# Patient Record
Sex: Male | Born: 1945 | Race: White | Hispanic: No | Marital: Married | Smoking: Former smoker
Health system: Southern US, Community
[De-identification: ages and names within clinical notes are randomized; demographics above are authoritative.]

## PROBLEM LIST (undated history)

## (undated) DIAGNOSIS — R042 Hemoptysis: Secondary | ICD-10-CM

## (undated) DIAGNOSIS — K219 Gastro-esophageal reflux disease without esophagitis: Secondary | ICD-10-CM

## (undated) DIAGNOSIS — J439 Emphysema, unspecified: Secondary | ICD-10-CM

## (undated) DIAGNOSIS — M4317 Spondylolisthesis, lumbosacral region: Secondary | ICD-10-CM

## (undated) DIAGNOSIS — M1990 Unspecified osteoarthritis, unspecified site: Secondary | ICD-10-CM

## (undated) DIAGNOSIS — K5792 Diverticulitis of intestine, part unspecified, without perforation or abscess without bleeding: Secondary | ICD-10-CM

## (undated) DIAGNOSIS — J449 Chronic obstructive pulmonary disease, unspecified: Secondary | ICD-10-CM

## (undated) DIAGNOSIS — N400 Enlarged prostate without lower urinary tract symptoms: Secondary | ICD-10-CM

## (undated) DIAGNOSIS — J310 Chronic rhinitis: Secondary | ICD-10-CM

## (undated) HISTORY — PX: HERNIA REPAIR: SHX51

## (undated) HISTORY — PX: COLONOSCOPY: SHX174

## (undated) HISTORY — DX: Emphysema, unspecified: J43.9

## (undated) HISTORY — DX: Benign prostatic hyperplasia without lower urinary tract symptoms: N40.0

## (undated) HISTORY — DX: Chronic obstructive pulmonary disease, unspecified: J44.9

## (undated) HISTORY — DX: Gastro-esophageal reflux disease without esophagitis: K21.9

## (undated) HISTORY — DX: Diverticulitis of intestine, part unspecified, without perforation or abscess without bleeding: K57.92

## (undated) HISTORY — PX: NASAL SINUS SURGERY: SHX719

## (undated) HISTORY — PX: TONSILLECTOMY: SUR1361

## (undated) HISTORY — DX: Hemoptysis: R04.2

## (undated) HISTORY — DX: Spondylolisthesis, lumbosacral region: M43.17

## (undated) HISTORY — PX: OTHER SURGICAL HISTORY: SHX169

## (undated) HISTORY — DX: Unspecified osteoarthritis, unspecified site: M19.90

## (undated) HISTORY — DX: Chronic rhinitis: J31.0

## (undated) HISTORY — PX: TRANSURETHRAL RESECTION OF PROSTATE: SHX73

---

## 2009-07-10 LAB — PULMONARY FUNCTION TEST

## 2013-02-01 LAB — PULMONARY FUNCTION TEST

## 2013-02-07 LAB — PULMONARY FUNCTION TEST

## 2013-02-22 DIAGNOSIS — R911 Solitary pulmonary nodule: Secondary | ICD-10-CM | POA: Insufficient documentation

## 2013-02-23 ENCOUNTER — Encounter: Payer: Self-pay | Admitting: Surgery

## 2013-02-23 ENCOUNTER — Institutional Professional Consult (permissible substitution) (INDEPENDENT_AMBULATORY_CARE_PROVIDER_SITE_OTHER): Payer: Medicare Other | Admitting: Surgery

## 2013-02-23 VITALS — BP 113/69 | HR 74 | Resp 20 | Ht 71.0 in | Wt 156.0 lb

## 2013-02-23 DIAGNOSIS — R911 Solitary pulmonary nodule: Secondary | ICD-10-CM

## 2013-02-24 ENCOUNTER — Other Ambulatory Visit: Payer: Self-pay | Admitting: *Deleted

## 2013-02-24 ENCOUNTER — Encounter: Payer: Self-pay | Admitting: Surgery

## 2013-02-24 DIAGNOSIS — R911 Solitary pulmonary nodule: Secondary | ICD-10-CM

## 2013-02-24 NOTE — Progress Notes (Signed)
PCP is Bretta Bang, DO Referring Provider is Sharyon Medicus, MD  Chief Complaint  Patient presents with  . Lung Lesion    Surgical eval on Pulmonary nodule, PET Scan 02/18/13, Chest CT 02/08/13    HPI:  The patient is a 67 year old former smoker with COPD who lives in [place], [place] and was first evaluated by Dr. Artis Flock of [facility] on 02/01/2013. His pulmonary history dates back to 2011 when he developed some shortness of breath while hiking in [place]. Upon return to [place] he underwent workup including a CXR on 06/14/2009 that showed some linear streaking in the lung bases suggesting atelectasis or scarring. PFT's on 07/10/2009 at [facility] showed an FEV1 of 2.24  (63% predicted), and FVC of 5.27 (119%), and an FEV1/FVC of 42%. Diffusion capacity was 88%. There was no significant improvement in spirometry with bronchodilators. He was started on ProAir [facility] inhaler and in Feb 2014 he was started on Spiriva which he thinks improved his breathing. He enrolled in pulmonary rehab and did well. He says that he currently walks 3-4 miles per day but does get short of breath walking up hills. He denies cough and sputum production. He had a CXR done on 02/01/2013 which showed a question of a small mass at the right apex approximately 1.5 cm in size that was not seen on the prior exam. He underwent a CT of the chest on 02/08/2013 that showed a solid 13 mm nodule in the middle of the RUL that was suspicious for malignancy and an ill-defined spiculated density in the right lung apex that could be scarring or a second malignant nodule. There were significant emphysematous changes in the lungs, particularly the upper lobes. A PET scan at [facility] on 02/18/2013 showed hypermetabolic activity in both right upper lobe nodules but no SUV was reported. There was a question of three mildly hypermetabolic foci in the region of the right hilum. There was a mildly  hypermetabolic focus along the mesenteric side of a loop of small bowel which may be physiologic. He had repeat PFT's in the pulmonary medicine office that showed an FEV! Of 1.61 (45%) and FVC of 3.83 (82%) and a ratio of 42% consistent with severe obstructive lung disease. Compared to his previous results in 2011 there was a significant reduction in the FEV1 and FVC.   He did have a cardiac stress test in 2011 at [facility] that was reportedly negative for inducible ischemia.  Past Medical History  Diagnosis Date  . Emphysema   . Hemoptysis   . BPH (benign prostatic hyperplasia)   . COPD (chronic obstructive pulmonary disease)   . DJD (degenerative joint disease)     hands  . GERD (gastroesophageal reflux disease)   . Diverticulitis   . Osteoarthritis   . Rhinitis   . Spondylolisthesis at L5-S1 level from prior trauma. Underwent R & L lumbar facet radiofrequency denervation.       squamous papilloma removed in 2012, left ture vocal cord    Past Surgical History  Procedure Laterality Date  . Hernia repair      bilateral injuinal hernia repair 2012 Dr Dimas Chyle  . Colonoscopy      2008/2013, adenomatous polyps  . Compression fracture   l4 repair    . 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 Left knee arthroscopy, removed calcified deposit    .  Left patella dislocation repair    . Nasal sinus surgery    . Tonsillectomy    . Transurethral resection of prostate      1996    Family History  Problem Relation Age of Onset  . Heart disease Father   . Alzheimer's disease Mother     Social History History  Substance Use Topics  . Smoking status: Former Smoker    Types: Cigarettes    Quit date: 03/17/2008  . Smokeless tobacco: Never Used  . Alcohol Use: Yes    Current Outpatient Prescriptions  Medication Sig Dispense Refill  . acidophilus (RISAQUAD) CAPS capsule Take 1 capsule by mouth daily.      .  Cholecalciferol (VITAMIN D) 2000 UNITS CAPS Take by mouth daily.      . fluticasone (FLONASE) 50 MCG/ACT nasal spray Place into both nostrils daily.      . Fluticasone Furoate-Vilanterol (BREO ELLIPTA) 100-25 MCG/INH AEPB Inhale into the lungs daily.      Marland Kitchen ibuprofen (ADVIL,MOTRIN) 200 MG tablet Take 200 mg by mouth every 6 (six) hours as needed.      Marland Kitchen omeprazole (PRILOSEC) 20 MG capsule Take 20 mg by mouth 2 (two) times daily before a meal.      . sildenafil (VIAGRA) 50 MG tablet Take 50 mg by mouth daily as needed for erectile dysfunction.      Marland Kitchen tiotropium (SPIRIVA) 18 MCG inhalation capsule Place 18 mcg into inhaler and inhale daily.      . valACYclovir (VALTREX) 500 MG tablet Take 500 mg by mouth 2 (two) times daily.       No current facility-administered medications for this visit.    No Known Allergies  Review of Systems  Constitutional: Positive for appetite change and unexpected weight change. Negative for fever, chills, diaphoresis, activity change and fatigue.       His wife feels that his appetite and weight have been reduced over the past year.  HENT: Negative.   Eyes: Negative.   Respiratory: Positive for shortness of breath. Negative for cough.        Exertional  No sputum production  Cardiovascular: Negative for chest pain, palpitations and leg swelling.  Gastrointestinal: Negative.   Endocrine: Negative.   Genitourinary: Negative.   Musculoskeletal: Positive for arthralgias and back pain. Negative for gait problem and joint swelling.  Skin: Negative.   Allergic/Immunologic: Negative.   Neurological: Negative.   Hematological: Negative.   Psychiatric/Behavioral: Negative.     BP 113/69  Pulse 74  Resp 20  Ht 5\' 11"  (1.803 m)  Wt 156 lb (70.761 kg)  BMI 21.77 kg/m2  SpO2 98% Physical Exam  Constitutional: He is oriented to person, place, and time. He appears well-developed and well-nourished. No distress.  HENT:  Head: Normocephalic and atraumatic.    Mouth/Throat: Oropharynx is clear and moist. No oropharyngeal exudate.  Eyes: Conjunctivae and EOM are normal. Pupils are equal, round, and reactive to light.  Neck: Normal range of motion. Neck supple. No JVD present. No tracheal deviation present. No thyromegaly present.  Cardiovascular: Normal rate, regular rhythm, normal heart sounds and intact distal pulses.   No murmur heard. Pulmonary/Chest: Effort normal. He has no wheezes. He has no rales. He exhibits no tenderness.  Diminished breath sounds throughout  Abdominal: Soft. Bowel sounds are normal. He exhibits no distension and no mass. There is no tenderness.  Musculoskeletal: Normal range of motion. He exhibits no edema and no tenderness.  Lymphadenopathy:    He has  no cervical adenopathy.  Neurological: He is alert and oriented to person, place, and time. He has normal strength. No cranial nerve deficit or sensory deficit.  Skin: Skin is warm and dry.  Psychiatric: He has a normal mood and affect.     Diagnostic Tests:  As noted in HPI. I personally reviewed the chest CT and PET scan on disc. Both done at outside institutions.   Impression:  He has two hypermetabolic nodules in the right upper lobe. The more central and solid nodule is highly suspicious for a lung cancer. The more ill-defined and spiculated nodule in the apex could be a synchronous primary cancer or an area of scar or inflammation. It is also possible that one of these is a primary and one is a metastasis. I don't see any other clear areas of hypermetabolism to suggest metastasis. The radiologist questioned the possibility of faint hypermetabolism in the right hilum but I really don't see this and there is no lymphadenopathy on the CT to correlate with this. There is no mediastinal adenopathy. His PFT's are borderline for right upper lobectomy although the RUL has significant bullous changes so it may not be contributing much to his lung function. The more central  and suspicious lung nodule would not be resectable without lobectomy. Both of these lesion would be amenable to SBRT but the prognosis for disease free survival would be significantly lower with XRT than with complete surgical removal. I discussed the options of surgery for right upper lobectomy vs XRT with the patient and his wife. We discussed the benefits and risks of both treatment modalities and they clearly want to proceed with surgical removal. They understand the higher risk of lobectomy given his lung function and that he may have more exertional dyspnea postop. If we are going to treat this surgically I don't think a biopsy of the nodules is needed since a negative biopsy will not change things and he would be at significant risk of lung collapse with a biopsy that may require a chest tube and prolonged hospitalization. This would only increase his risk for lobectomy. I reviewed the CT and PET scans with the patient and his wife, discussed the surgery of right thoracotomy and right upper lobectomy with them including alternatives, benefits and risks, including but not limited to bleeding, blood transfusion, infection, prolonged air leak, pleural space problems, respiratory or other organ failure, and death. I also discussed the possibility that the final pathology may show hilar or mediastinal lymph node mets requiring chemotherapy. They understand all of this and agree to proceed.    Plan:  The patient will be scheduled for flexible bronchoscopy, right thoracotomy and right upper lobectomy shortly after the Christmas holiday.

## 2013-03-01 ENCOUNTER — Encounter (HOSPITAL_COMMUNITY): Payer: Self-pay | Admitting: Pharmacy Technician

## 2013-03-11 ENCOUNTER — Encounter (HOSPITAL_COMMUNITY): Payer: Self-pay | Admitting: *Deleted

## 2013-03-14 MED ORDER — DEXTROSE 5 % IV SOLN
1.5000 g | INTRAVENOUS | Status: AC
  Administered 2013-03-15: 1.5 g via INTRAVENOUS
  Filled 2013-03-14: qty 1.5

## 2013-03-14 NOTE — H&P (Signed)
[address]       [name] [address]             [phone]      Cardiothoracic Surgery History and Physical   PCP is Bretta Bang, DO Referring Provider is Sharyon Medicus, MD    Chief Complaint   Patient presents with   .  Lung Lesion       Surgical eval on Pulmonary nodule, PET Scan 02/18/13, Chest CT 02/08/13      HPI:   The patient is a 67 year old former smoker with COPD who lives in [place], [place] and was first evaluated by Dr. Artis Flock of [facility]ology on 02/01/2013. His pulmonary history dates back to 2011 when he developed some shortness of breath while hiking in [place]. Upon return to [place] he underwent workup including a CXR on 06/14/2009 that showed some linear streaking in the lung bases suggesting atelectasis or scarring. PFT's on 07/10/2009 at [facility] showed an FEV1 of 2.24  (63% predicted), and FVC of 5.27 (119%), and an FEV1/FVC of 42%. Diffusion capacity was 88%. There was no significant improvement in spirometry with bronchodilators. He was started on ProAir [facility] inhaler and in Feb 2014 he was started on Spiriva which he thinks improved his breathing. He enrolled in pulmonary rehab and did well. He says that he currently walks 3-4 miles per day but does get short of breath walking up hills. He denies cough and sputum production. He had a CXR done on 02/01/2013 which showed a question of a small mass at the right apex approximately 1.5 cm in size that was not seen on the prior exam. He underwent a CT of the chest on 02/08/2013 that showed a solid 13 mm nodule in the middle of the RUL that was suspicious for malignancy and an ill-defined spiculated density in the right lung apex that could be scarring or a second malignant nodule. There were significant emphysematous changes in the lungs, particularly the upper lobes. A PET scan at [facility] on 02/18/2013 showed hypermetabolic activity in both right upper lobe  nodules but no SUV was reported. There was a question of three mildly hypermetabolic foci in the region of the right hilum. There was a mildly hypermetabolic focus along the mesenteric side of a loop of small bowel which may be physiologic. He had repeat PFT's in the pulmonary medicine office that showed an FEV! Of 1.61 (45%) and FVC of 3.83 (82%) and a ratio of 42% consistent with severe obstructive lung disease. Compared to his previous results in 2011 there was a significant reduction in the FEV1 and FVC.    He did have a cardiac stress test in 2011 at [facility] that was reportedly negative for inducible ischemia.    Past Medical History   Diagnosis  Date   .  Emphysema     .  Hemoptysis     .  BPH (benign prostatic hyperplasia)     .  COPD (chronic obstructive pulmonary disease)     .  DJD (degenerative joint disease)         hands   .  GERD (gastroesophageal reflux disease)     .  Diverticulitis     .  Osteoarthritis     .  Rhinitis     .  Spondylolisthesis at L5-S1 level from prior trauma. Underwent R & L lumbar facet radiofrequency denervation.  squamous papilloma removed in 2012, left ture vocal cord         Past Surgical History   Procedure  Laterality  Date   .  Hernia repair           bilateral injuinal hernia repair 2012 Dr Dimas Chyle   .  Colonoscopy           2008/2013, adenomatous polyps   .  Compression fracture   l4 repair       .  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  Left knee arthroscopy, removed calcified deposit       .  Left patella dislocation repair       .  Nasal sinus surgery       .  Tonsillectomy       .  Transurethral resection of prostate           1996      Family History   Problem  Relation  Age of Onset   .  Heart disease  Father     .  Alzheimer's disease  Mother        Social History History   Substance Use Topics   .  Smoking status:  Former Smoker        Types:  Cigarettes       Quit date:  03/17/2008   .  Smokeless tobacco:  Never Used   .  Alcohol Use:  Yes         Current Outpatient Prescriptions   Medication  Sig  Dispense  Refill   .  acidophilus (RISAQUAD) CAPS capsule  Take 1 capsule by mouth daily.         .  Cholecalciferol (VITAMIN D) 2000 UNITS CAPS  Take by mouth daily.         .  fluticasone (FLONASE) 50 MCG/ACT nasal spray  Place into both nostrils daily.         .  Fluticasone Furoate-Vilanterol (BREO ELLIPTA) 100-25 MCG/INH AEPB  Inhale into the lungs daily.         Marland Kitchen  ibuprofen (ADVIL,MOTRIN) 200 MG tablet  Take 200 mg by mouth every 6 (six) hours as needed.         Marland Kitchen  omeprazole (PRILOSEC) 20 MG capsule  Take 20 mg by mouth 2 (two) times daily before a meal.         .  sildenafil (VIAGRA) 50 MG tablet  Take 50 mg by mouth daily as needed for erectile dysfunction.         Marland Kitchen  tiotropium (SPIRIVA) 18 MCG inhalation capsule  Place 18 mcg into inhaler and inhale daily.         .  valACYclovir (VALTREX) 500 MG tablet  Take 500 mg by mouth 2 (two) times daily.          No current facility-administered medications for this visit.     No Known Allergies   Review of Systems  Constitutional: Positive for appetite change and unexpected weight change. Negative for fever, chills, diaphoresis, activity change and fatigue.        His wife feels that his appetite and weight have been reduced over the past year.  HENT: Negative.   Eyes: Negative.   Respiratory: Positive for shortness of breath. Negative for cough.         Exertional  No sputum production  Cardiovascular: Negative for chest pain, palpitations and leg swelling.  Gastrointestinal: Negative.   Endocrine: Negative.   Genitourinary: Negative.   Musculoskeletal: Positive for arthralgias and back pain. Negative for gait problem and joint swelling.  Skin: Negative.   Allergic/Immunologic: Negative.   Neurological: Negative.   Hematological: Negative.     Psychiatric/Behavioral: Negative.       BP 113/69  Pulse 74  Resp 20  Ht 5\' 11"  (1.803 m)  Wt 156 lb (70.761 kg)  BMI 21.77 kg/m2  SpO2 98% Physical Exam  Constitutional: He is oriented to person, place, and time. He appears well-developed and well-nourished. No distress.  HENT:   Head: Normocephalic and atraumatic.   Mouth/Throat: Oropharynx is clear and moist. No oropharyngeal exudate.  Eyes: Conjunctivae and EOM are normal. Pupils are equal, round, and reactive to light.  Neck: Normal range of motion. Neck supple. No JVD present. No tracheal deviation present. No thyromegaly present.  Cardiovascular: Normal rate, regular rhythm, normal heart sounds and intact distal pulses.    No murmur heard. Pulmonary/Chest: Effort normal. He has no wheezes. He has no rales. He exhibits no tenderness.  Diminished breath sounds throughout  Abdominal: Soft. Bowel sounds are normal. He exhibits no distension and no mass. There is no tenderness.  Musculoskeletal: Normal range of motion. He exhibits no edema and no tenderness.  Lymphadenopathy:    He has no cervical adenopathy.  Neurological: He is alert and oriented to person, place, and time. He has normal strength. No cranial nerve deficit or sensory deficit.  Skin: Skin is warm and dry.  Psychiatric: He has a normal mood and affect.    Diagnostic Tests:   As noted in HPI. I personally reviewed the chest CT and PET scan on disc. Both done at outside institutions.   Impression:   He has two hypermetabolic nodules in the right upper lobe. The more central and solid nodule is highly suspicious for a lung cancer. The more ill-defined and spiculated nodule in the apex could be a synchronous primary cancer or an area of scar or inflammation. It is also possible that one of these is a primary and one is a metastasis. I don't see any other clear areas of hypermetabolism to suggest metastasis. The radiologist questioned the possibility of faint  hypermetabolism in the right hilum but I really don't see this and there is no lymphadenopathy on the CT to correlate with this. There is no mediastinal adenopathy. His PFT's are borderline for right upper lobectomy although the RUL has significant bullous changes so it may not be contributing much to his lung function. The more central and suspicious lung nodule would not be resectable without lobectomy. Both of these lesion would be amenable to SBRT but the prognosis for disease free survival would be significantly lower with XRT than with complete surgical removal. I discussed the options of surgery for right upper lobectomy vs XRT with the patient and his wife. We discussed the benefits and risks of both treatment modalities and they clearly want to proceed with surgical removal. They understand the higher risk of lobectomy given his lung function and that he may have more exertional dyspnea postop. If we are going to treat this surgically I don't think a biopsy of the nodules is needed since a negative biopsy will not change things and he would be at significant risk of lung collapse with a biopsy that may require a chest tube and prolonged hospitalization. This would only increase  his risk for lobectomy. I reviewed the CT and PET scans with the patient and his wife, discussed the surgery of right thoracotomy and right upper lobectomy with them including alternatives, benefits and risks, including but not limited to bleeding, blood transfusion, infection, prolonged air leak, pleural space problems, respiratory or other organ failure, and death. I also discussed the possibility that the final pathology may show hilar or mediastinal lymph node mets requiring chemotherapy. They understand all of this and agree to proceed.   Plan:  The patient will be scheduled for flexible bronchoscopy, right thoracotomy and right upper lobectomy shortly after the Christmas holiday.

## 2013-03-15 ENCOUNTER — Encounter (HOSPITAL_COMMUNITY): Payer: Self-pay | Admitting: Anesthesiology

## 2013-03-15 ENCOUNTER — Encounter (HOSPITAL_COMMUNITY): Payer: Medicare Other | Admitting: Certified Registered Nurse Anesthetist

## 2013-03-15 ENCOUNTER — Encounter (HOSPITAL_COMMUNITY)
Admission: RE | Disposition: A | Discharge status: Home Health | Payer: Self-pay | Source: Ambulatory Visit | Attending: Surgery

## 2013-03-15 ENCOUNTER — Inpatient Hospital Stay (HOSPITAL_COMMUNITY): Payer: Medicare Other

## 2013-03-15 ENCOUNTER — Inpatient Hospital Stay (HOSPITAL_COMMUNITY): Payer: Medicare Other | Admitting: Certified Registered Nurse Anesthetist

## 2013-03-15 ENCOUNTER — Inpatient Hospital Stay (HOSPITAL_COMMUNITY)
Admission: RE | Admit: 2013-03-15 | Discharge: 2013-03-29 | DRG: 164 | Disposition: A | Discharge status: Home Health | Payer: Medicare Other | Source: Ambulatory Visit | Attending: Surgery | Admitting: Surgery

## 2013-03-15 DIAGNOSIS — K5900 Constipation, unspecified: Secondary | ICD-10-CM | POA: Diagnosis not present

## 2013-03-15 DIAGNOSIS — Z79899 Other long term (current) drug therapy: Secondary | ICD-10-CM

## 2013-03-15 DIAGNOSIS — J95812 Postprocedural air leak: Secondary | ICD-10-CM | POA: Diagnosis not present

## 2013-03-15 DIAGNOSIS — D381 Neoplasm of uncertain behavior of trachea, bronchus and lung: Secondary | ICD-10-CM

## 2013-03-15 DIAGNOSIS — R110 Nausea: Secondary | ICD-10-CM | POA: Diagnosis not present

## 2013-03-15 DIAGNOSIS — M19049 Primary osteoarthritis, unspecified hand: Secondary | ICD-10-CM | POA: Diagnosis present

## 2013-03-15 DIAGNOSIS — R918 Other nonspecific abnormal finding of lung field: Secondary | ICD-10-CM | POA: Diagnosis present

## 2013-03-15 DIAGNOSIS — C3410 Malignant neoplasm of upper lobe, unspecified bronchus or lung: Principal | ICD-10-CM | POA: Diagnosis present

## 2013-03-15 DIAGNOSIS — Z87891 Personal history of nicotine dependence: Secondary | ICD-10-CM

## 2013-03-15 DIAGNOSIS — K219 Gastro-esophageal reflux disease without esophagitis: Secondary | ICD-10-CM | POA: Diagnosis present

## 2013-03-15 DIAGNOSIS — C771 Secondary and unspecified malignant neoplasm of intrathoracic lymph nodes: Secondary | ICD-10-CM | POA: Diagnosis present

## 2013-03-15 DIAGNOSIS — I4510 Unspecified right bundle-branch block: Secondary | ICD-10-CM | POA: Diagnosis present

## 2013-03-15 DIAGNOSIS — J438 Other emphysema: Secondary | ICD-10-CM | POA: Diagnosis present

## 2013-03-15 DIAGNOSIS — R911 Solitary pulmonary nodule: Secondary | ICD-10-CM

## 2013-03-15 HISTORY — PX: THORACOTOMY/LOBECTOMY: SHX6116

## 2013-03-15 HISTORY — PX: VIDEO BRONCHOSCOPY: SHX5072

## 2013-03-15 LAB — BLOOD GAS, ARTERIAL
Acid-base deficit: 3.5 mmol/L — ABNORMAL HIGH (ref 0.0–2.0)
Bicarbonate: 19.4 mEq/L — ABNORMAL LOW (ref 20.0–24.0)
O2 Saturation: 96.7 %
Patient temperature: 98.6
TCO2: 20.2 mmol/L (ref 0–100)
pO2, Arterial: 78.3 mmHg — ABNORMAL LOW (ref 80.0–100.0)

## 2013-03-15 LAB — CBC
HCT: 48.9 % (ref 39.0–52.0)
HCT: 45.3 % (ref 39.0–52.0)
Hemoglobin: 17 g/dL (ref 13.0–17.0)
Hemoglobin: 15.5 g/dL (ref 13.0–17.0)
MCH: 33.4 pg (ref 26.0–34.0)
MCH: 33.4 pg (ref 26.0–34.0)
MCHC: 34.8 g/dL (ref 30.0–36.0)
MCHC: 34.2 g/dL (ref 30.0–36.0)
MCV: 96.1 fL (ref 78.0–100.0)
MCV: 97.6 fL (ref 78.0–100.0)
Platelets: 204 10*3/uL (ref 150–400)
RBC: 5.09 MIL/uL (ref 4.22–5.81)
RBC: 4.64 MIL/uL (ref 4.22–5.81)
WBC: 15.5 10*3/uL — ABNORMAL HIGH (ref 4.0–10.5)

## 2013-03-15 LAB — URINALYSIS, ROUTINE W REFLEX MICROSCOPIC
Glucose, UA: NEGATIVE mg/dL
Ketones, ur: NEGATIVE mg/dL
Leukocytes, UA: NEGATIVE
Protein, ur: NEGATIVE mg/dL
Specific Gravity, Urine: 1.015 (ref 1.005–1.030)
pH: 5.5 (ref 5.0–8.0)

## 2013-03-15 LAB — POCT I-STAT 3, ART BLOOD GAS (G3+)
Acid-base deficit: 4 mmol/L — ABNORMAL HIGH (ref 0.0–2.0)
Bicarbonate: 21.9 mEq/L (ref 20.0–24.0)
O2 Saturation: 93 %
Patient temperature: 98.4
TCO2: 23 mmol/L (ref 0–100)
pCO2 arterial: 42.4 mmHg (ref 35.0–45.0)
pO2, Arterial: 74 mmHg — ABNORMAL LOW (ref 80.0–100.0)

## 2013-03-15 LAB — COMPREHENSIVE METABOLIC PANEL
ALT: 14 U/L (ref 0–53)
AST: 15 U/L (ref 0–37)
BUN: 13 mg/dL (ref 6–23)
CO2: 17 mEq/L — ABNORMAL LOW (ref 19–32)
Calcium: 9.1 mg/dL (ref 8.4–10.5)
GFR calc Af Amer: >90 mL/min (ref >90)
GFR calc non Af Amer: 86 mL/min — ABNORMAL LOW (ref >90)
Glucose, Bld: 99 mg/dL (ref 70–99)
Sodium: 143 mEq/L (ref 137–147)
Total Bilirubin: 0.4 mg/dL (ref 0.3–1.2)

## 2013-03-15 LAB — BASIC METABOLIC PANEL
BUN: 12 mg/dL (ref 6–23)
CO2: 22 mEq/L (ref 19–32)
Calcium: 8.3 mg/dL — ABNORMAL LOW (ref 8.4–10.5)
GFR calc non Af Amer: >90 mL/min (ref >90)
Glucose, Bld: 120 mg/dL — ABNORMAL HIGH (ref 70–99)
Sodium: 142 mEq/L (ref 137–147)

## 2013-03-15 LAB — ABO/RH: ABO/RH(D): A POS

## 2013-03-15 LAB — TYPE AND SCREEN
ABO/RH(D): A POS
Antibody Screen: NEGATIVE

## 2013-03-15 LAB — APTT: aPTT: 33 seconds (ref 24–37)

## 2013-03-15 LAB — URINE MICROSCOPIC-ADD ON

## 2013-03-15 SURGERY — BRONCHOSCOPY, VIDEO-ASSISTED
Anesthesia: General | Laterality: Right

## 2013-03-15 MED ORDER — LACTATED RINGERS IV SOLN
INTRAVENOUS | Status: DC | PRN
  Administered 2013-03-15 (×2): via INTRAVENOUS

## 2013-03-15 MED ORDER — KCL IN DEXTROSE-NACL 20-5-0.45 MEQ/L-%-% IV SOLN
INTRAVENOUS | Status: DC
  Administered 2013-03-15: 75 mL/h via INTRAVENOUS
  Administered 2013-03-16: 09:00:00 via INTRAVENOUS
  Filled 2013-03-15 (×3): qty 1000

## 2013-03-15 MED ORDER — SENNOSIDES-DOCUSATE SODIUM 8.6-50 MG PO TABS
1.0000 | ORAL_TABLET | Freq: Every evening | ORAL | Status: DC | PRN
  Filled 2013-03-15: qty 1

## 2013-03-15 MED ORDER — DIPHENHYDRAMINE HCL 12.5 MG/5ML PO ELIX
12.5000 mg | ORAL_SOLUTION | Freq: Four times a day (QID) | ORAL | Status: DC | PRN
  Filled 2013-03-15: qty 5

## 2013-03-15 MED ORDER — HYDROMORPHONE 0.3 MG/ML IV SOLN
INTRAVENOUS | Status: DC
  Administered 2013-03-15: 20:00:00 via INTRAVENOUS
  Administered 2013-03-16: 1.2 mg via INTRAVENOUS
  Administered 2013-03-16: 0.9 mg via INTRAVENOUS
  Administered 2013-03-16: 1.5 mg via INTRAVENOUS
  Administered 2013-03-16: 0.6 mg via INTRAVENOUS
  Administered 2013-03-16: 1.79 mg via INTRAVENOUS
  Administered 2013-03-17: 0.9 mg via INTRAVENOUS
  Administered 2013-03-17: 1.5 mg via INTRAVENOUS
  Administered 2013-03-17: 0.3 mg via INTRAVENOUS
  Administered 2013-03-17: 1.2 mg via INTRAVENOUS
  Administered 2013-03-17: 0.4 mg via INTRAVENOUS
  Administered 2013-03-18: 1.2 mg via INTRAVENOUS
  Administered 2013-03-18: 0.6 mg via INTRAVENOUS
  Administered 2013-03-18: 0.3 mg via INTRAVENOUS
  Filled 2013-03-15 (×2): qty 25

## 2013-03-15 MED ORDER — ROCURONIUM BROMIDE 100 MG/10ML IV SOLN
INTRAVENOUS | Status: DC | PRN
  Administered 2013-03-15: 50 mg via INTRAVENOUS

## 2013-03-15 MED ORDER — KETOROLAC TROMETHAMINE 30 MG/ML IJ SOLN
INTRAMUSCULAR | Status: AC
  Administered 2013-03-15: 15 mg
  Filled 2013-03-15: qty 1

## 2013-03-15 MED ORDER — LIDOCAINE HCL (CARDIAC) 20 MG/ML IV SOLN
INTRAVENOUS | Status: DC | PRN
  Administered 2013-03-15: 50 mg via INTRAVENOUS

## 2013-03-15 MED ORDER — MIDAZOLAM HCL 5 MG/5ML IJ SOLN
INTRAMUSCULAR | Status: DC | PRN
  Administered 2013-03-15: 2 mg via INTRAVENOUS

## 2013-03-15 MED ORDER — SODIUM CHLORIDE 0.9 % IJ SOLN: 9.0000 mL | INTRAMUSCULAR | Status: DC | PRN

## 2013-03-15 MED ORDER — NEOSTIGMINE METHYLSULFATE 1 MG/ML IJ SOLN
INTRAMUSCULAR | Status: DC | PRN
  Administered 2013-03-15: 5 mg via INTRAVENOUS

## 2013-03-15 MED ORDER — BUPIVACAINE ON-Q PAIN PUMP (FOR ORDER SET NO CHG)
INJECTION | Status: DC
  Filled 2013-03-15: qty 1

## 2013-03-15 MED ORDER — LACTATED RINGERS IV SOLN
INTRAVENOUS | Status: DC | PRN
  Administered 2013-03-15: 11:00:00 via INTRAVENOUS

## 2013-03-15 MED ORDER — ONDANSETRON HCL 4 MG/2ML IJ SOLN
INTRAMUSCULAR | Status: DC | PRN
  Administered 2013-03-15: 4 mg via INTRAVENOUS

## 2013-03-15 MED ORDER — HYDROMORPHONE 0.3 MG/ML IV SOLN
INTRAVENOUS | Status: DC
  Administered 2013-03-15: 3.1 mg via INTRAVENOUS
  Administered 2013-03-15: 0.6 mg via INTRAVENOUS
  Administered 2013-03-15: 0.3 mg via INTRAVENOUS

## 2013-03-15 MED ORDER — DEXTROSE 5 % IV SOLN
1.5000 g | Freq: Two times a day (BID) | INTRAVENOUS | Status: AC
  Administered 2013-03-15 – 2013-03-16 (×2): 1.5 g via INTRAVENOUS
  Filled 2013-03-15 (×2): qty 1.5

## 2013-03-15 MED ORDER — KETOROLAC TROMETHAMINE 15 MG/ML IJ SOLN: 15.0000 mg | Freq: Four times a day (QID) | INTRAMUSCULAR | Status: DC | PRN

## 2013-03-15 MED ORDER — ACETAMINOPHEN 160 MG/5ML PO SOLN: 1000.0000 mg | Freq: Four times a day (QID) | ORAL | Status: AC

## 2013-03-15 MED ORDER — ACETAMINOPHEN 500 MG PO TABS
1000.0000 mg | ORAL_TABLET | Freq: Four times a day (QID) | ORAL | Status: AC
  Administered 2013-03-16 (×2): 1000 mg via ORAL
  Filled 2013-03-15 (×2): qty 2

## 2013-03-15 MED ORDER — GLYCOPYRROLATE 0.2 MG/ML IJ SOLN
INTRAMUSCULAR | Status: DC | PRN
  Administered 2013-03-15: 0.6 mg via INTRAVENOUS

## 2013-03-15 MED ORDER — FENTANYL CITRATE 0.05 MG/ML IJ SOLN
INTRAMUSCULAR | Status: DC | PRN
  Administered 2013-03-15: 50 ug via INTRAVENOUS
  Administered 2013-03-15: 100 ug via INTRAVENOUS
  Administered 2013-03-15 (×3): 50 ug via INTRAVENOUS
  Administered 2013-03-15 (×2): 100 ug via INTRAVENOUS

## 2013-03-15 MED ORDER — TIOTROPIUM BROMIDE MONOHYDRATE 18 MCG IN CAPS
18.0000 ug | ORAL_CAPSULE | Freq: Every day | RESPIRATORY_TRACT | Status: DC
  Administered 2013-03-16 – 2013-03-29 (×13): 18 ug via RESPIRATORY_TRACT
  Filled 2013-03-15 (×3): qty 5

## 2013-03-15 MED ORDER — LEVALBUTEROL HCL 0.63 MG/3ML IN NEBU
0.6300 mg | INHALATION_SOLUTION | Freq: Four times a day (QID) | RESPIRATORY_TRACT | Status: DC
  Administered 2013-03-15 – 2013-03-21 (×19): 0.63 mg via RESPIRATORY_TRACT
  Filled 2013-03-15 (×34): qty 3

## 2013-03-15 MED ORDER — NALOXONE HCL 0.4 MG/ML IJ SOLN: 0.4000 mg | INTRAMUSCULAR | Status: DC | PRN

## 2013-03-15 MED ORDER — DIPHENHYDRAMINE HCL 50 MG/ML IJ SOLN: 12.5000 mg | Freq: Four times a day (QID) | INTRAMUSCULAR | Status: DC | PRN

## 2013-03-15 MED ORDER — ONDANSETRON HCL 4 MG/2ML IJ SOLN: 4.0000 mg | Freq: Once | INTRAMUSCULAR | Status: DC | PRN

## 2013-03-15 MED ORDER — MIDAZOLAM HCL 2 MG/2ML IJ SOLN
INTRAMUSCULAR | Status: AC
  Filled 2013-03-15: qty 2

## 2013-03-15 MED ORDER — VECURONIUM BROMIDE 10 MG IV SOLR
INTRAVENOUS | Status: DC | PRN
  Administered 2013-03-15 (×3): 2 mg via INTRAVENOUS
  Administered 2013-03-15: 1 mg via INTRAVENOUS

## 2013-03-15 MED ORDER — OXYCODONE HCL 5 MG PO TABS: 5.0000 mg | ORAL_TABLET | Freq: Once | ORAL | Status: DC | PRN

## 2013-03-15 MED ORDER — LACTATED RINGERS IV SOLN
INTRAVENOUS | Status: DC
  Administered 2013-03-15: 10:00:00 via INTRAVENOUS

## 2013-03-15 MED ORDER — POTASSIUM CHLORIDE 10 MEQ/50ML IV SOLN
10.0000 meq | Freq: Every day | INTRAVENOUS | Status: DC | PRN
  Filled 2013-03-15: qty 50

## 2013-03-15 MED ORDER — OXYCODONE HCL 5 MG/5ML PO SOLN: 5.0000 mg | Freq: Once | ORAL | Status: DC | PRN

## 2013-03-15 MED ORDER — 0.9 % SODIUM CHLORIDE (POUR BTL) OPTIME
TOPICAL | Status: DC | PRN
  Administered 2013-03-15: 4000 mL

## 2013-03-15 MED ORDER — ONDANSETRON HCL 4 MG/2ML IJ SOLN: 4.0000 mg | Freq: Four times a day (QID) | INTRAMUSCULAR | Status: DC | PRN

## 2013-03-15 MED ORDER — NALOXONE HCL 0.4 MG/ML IJ SOLN
0.4000 mg | INTRAMUSCULAR | Status: DC | PRN
  Administered 2013-03-15: 0.4 mg via INTRAVENOUS
  Filled 2013-03-15: qty 1

## 2013-03-15 MED ORDER — BISACODYL 5 MG PO TBEC
10.0000 mg | DELAYED_RELEASE_TABLET | Freq: Every day | ORAL | Status: DC
  Administered 2013-03-16 – 2013-03-28 (×9): 10 mg via ORAL
  Filled 2013-03-15 (×9): qty 2

## 2013-03-15 MED ORDER — HYDROMORPHONE HCL PF 1 MG/ML IJ SOLN
INTRAMUSCULAR | Status: AC
  Filled 2013-03-15: qty 1

## 2013-03-15 MED ORDER — OXYCODONE-ACETAMINOPHEN 5-325 MG PO TABS
1.0000 | ORAL_TABLET | ORAL | Status: DC | PRN
  Administered 2013-03-18 – 2013-03-23 (×19): 2 via ORAL
  Administered 2013-03-24: 1 via ORAL
  Administered 2013-03-24 – 2013-03-29 (×11): 2 via ORAL
  Filled 2013-03-15 (×31): qty 2

## 2013-03-15 MED ORDER — OXYCODONE HCL 5 MG PO TABS: 5.0000 mg | ORAL_TABLET | ORAL | Status: AC | PRN

## 2013-03-15 MED ORDER — BUPIVACAINE 0.5 % ON-Q PUMP SINGLE CATH 400 ML
400.0000 mL | INJECTION | Status: DC
  Administered 2013-03-15: 400 mL
  Filled 2013-03-15: qty 400

## 2013-03-15 MED ORDER — FLUTICASONE FUROATE-VILANTEROL 100-25 MCG/INH IN AEPB: 1.0000 | INHALATION_SPRAY | Freq: Every day | RESPIRATORY_TRACT | Status: DC

## 2013-03-15 MED ORDER — HYDROMORPHONE HCL PF 1 MG/ML IJ SOLN
0.2500 mg | INTRAMUSCULAR | Status: DC | PRN
  Administered 2013-03-15 (×4): 0.5 mg via INTRAVENOUS

## 2013-03-15 MED ORDER — FENTANYL CITRATE 0.05 MG/ML IJ SOLN
INTRAMUSCULAR | Status: AC
  Filled 2013-03-15: qty 2

## 2013-03-15 MED ORDER — HYDROMORPHONE 0.3 MG/ML IV SOLN
INTRAVENOUS | Status: AC
  Administered 2013-03-15: 0.3 mg via INTRAVENOUS
  Filled 2013-03-15: qty 25

## 2013-03-15 MED ORDER — PROPOFOL 10 MG/ML IV BOLUS
INTRAVENOUS | Status: DC | PRN
  Administered 2013-03-15: 30 mg via INTRAVENOUS
  Administered 2013-03-15: 170 mg via INTRAVENOUS

## 2013-03-15 MED ORDER — HEMOSTATIC AGENTS (NO CHARGE) OPTIME
TOPICAL | Status: DC | PRN
  Administered 2013-03-15: 1 via TOPICAL

## 2013-03-15 SURGICAL SUPPLY — 86 items
APPLICATOR TIP COSEAL (VASCULAR PRODUCTS) ×3 IMPLANT
BLADE SURG 11 STRL SS (BLADE) ×3 IMPLANT
BRUSH CYTOL CELLEBRITY 1.5X140 (MISCELLANEOUS) IMPLANT
CANISTER SUCTION 2500CC (MISCELLANEOUS) ×9 IMPLANT
CATH KIT ON Q 5IN SLV (PAIN MANAGEMENT) ×3 IMPLANT
CATH THORACIC 28FR (CATHETERS) ×3 IMPLANT
CATH THORACIC 36FR (CATHETERS) IMPLANT
CATH THORACIC 36FR RT ANG (CATHETERS) IMPLANT
CLIP TI MEDIUM 24 (CLIP) ×6 IMPLANT
CLIP TI WIDE RED SMALL 24 (CLIP) ×3 IMPLANT
CONN Y 3/8X3/8X3/8  BEN (MISCELLANEOUS) ×2
CONN Y 3/8X3/8X3/8 BEN (MISCELLANEOUS) ×4 IMPLANT
CONT SPEC 4OZ CLIKSEAL STRL BL (MISCELLANEOUS) ×9 IMPLANT
COTTONBALL LRG STERILE PKG (GAUZE/BANDAGES/DRESSINGS) IMPLANT
COVER SURGICAL LIGHT HANDLE (MISCELLANEOUS) ×3 IMPLANT
COVER TABLE BACK 60X90 (DRAPES) ×3 IMPLANT
DRAIN CHANNEL 32F RND 10.7 FF (WOUND CARE) ×3 IMPLANT
DRAPE LAPAROSCOPIC ABDOMINAL (DRAPES) ×3 IMPLANT
DRAPE WARM FLUID 44X44 (DRAPE) ×3 IMPLANT
DRILL BIT 7/64X5 (BIT) ×3 IMPLANT
ELECT BLADE 6.5 EXT (BLADE) ×6 IMPLANT
ELECT REM PT RETURN 9FT ADLT (ELECTROSURGICAL) ×3
ELECTRODE REM PT RTRN 9FT ADLT (ELECTROSURGICAL) ×2 IMPLANT
FORCEPS BIOP RJ4 1.8 (CUTTING FORCEPS) IMPLANT
GLOVE BIOGEL PI IND STRL 7.0 (GLOVE) ×2 IMPLANT
GLOVE BIOGEL PI INDICATOR 7.0 (GLOVE) ×1
GLOVE EUDERMIC 7 POWDERFREE (GLOVE) ×9 IMPLANT
GOWN PREVENTION PLUS XLARGE (GOWN DISPOSABLE) ×3 IMPLANT
GOWN STRL NON-REIN LRG LVL3 (GOWN DISPOSABLE) ×6 IMPLANT
HANDLE STAPLE ENDO GIA SHORT (STAPLE) ×1
KIT BASIN OR (CUSTOM PROCEDURE TRAY) ×3 IMPLANT
KIT ROOM TURNOVER OR (KITS) ×3 IMPLANT
MARKER SKIN DUAL TIP RULER LAB (MISCELLANEOUS) IMPLANT
NEEDLE 22X1 1/2 (OR ONLY) (NEEDLE) IMPLANT
NEEDLE BIOPSY TRANSBRONCH 21G (NEEDLE) IMPLANT
NS IRRIG 1000ML POUR BTL (IV SOLUTION) ×15 IMPLANT
OIL SILICONE PENTAX (PARTS (SERVICE/REPAIRS)) ×3 IMPLANT
PACK CHEST (CUSTOM PROCEDURE TRAY) ×3 IMPLANT
PAD ARMBOARD 7.5X6 YLW CONV (MISCELLANEOUS) ×6 IMPLANT
PATCH VASCULAR VASCU GUARD 1X6 (Vascular Products) IMPLANT
RELOAD EGIA 60 MED/THCK PURPLE (STAPLE) ×3 IMPLANT
RELOAD EGIA TRIS TAN 45 CVD (STAPLE) ×9 IMPLANT
RELOAD STAPLE TA45 3.5 REG BLU (ENDOMECHANICALS) ×9 IMPLANT
RELOAD TRI 2.0 60 XTHK VAS SUL (STAPLE) ×15 IMPLANT
SEALANT SURG COSEAL 4ML (VASCULAR PRODUCTS) IMPLANT
SEALANT SURG COSEAL 8ML (VASCULAR PRODUCTS) ×3 IMPLANT
SOLUTION ANTI FOG 6CC (MISCELLANEOUS) ×3 IMPLANT
SPECIMEN JAR MEDIUM (MISCELLANEOUS) ×3 IMPLANT
SPONGE GAUZE 4X4 12PLY (GAUZE/BANDAGES/DRESSINGS) ×3 IMPLANT
STAPLER ENDO GIA 12MM SHORT (STAPLE) ×2 IMPLANT
STAPLER ENDO NO KNIFE (STAPLE) ×3 IMPLANT
STAPLER TA30 4.8 NON-ABS (STAPLE) ×3 IMPLANT
SUT PROLENE 3 0 SH DA (SUTURE) IMPLANT
SUT PROLENE 4 0 RB 1 (SUTURE) ×1
SUT PROLENE 4-0 RB1 .5 CRCL 36 (SUTURE) ×2 IMPLANT
SUT SILK  1 MH (SUTURE) ×2
SUT SILK 1 MH (SUTURE) ×4 IMPLANT
SUT SILK 2 0SH CR/8 30 (SUTURE) ×3 IMPLANT
SUT SILK 3 0 SH CR/8 (SUTURE) IMPLANT
SUT VIC AB 1 CTX 36 (SUTURE) ×1
SUT VIC AB 1 CTX36XBRD ANBCTR (SUTURE) ×2 IMPLANT
SUT VIC AB 2 TP1 27 (SUTURE) ×3 IMPLANT
SUT VIC AB 2-0 CT1 27 (SUTURE)
SUT VIC AB 2-0 CT1 TAPERPNT 27 (SUTURE) IMPLANT
SUT VIC AB 2-0 CTX 36 (SUTURE) ×6 IMPLANT
SUT VIC AB 2-0 UR6 27 (SUTURE) IMPLANT
SUT VIC AB 3-0 MH 27 (SUTURE) ×6 IMPLANT
SUT VIC AB 3-0 SH 27 (SUTURE) ×1
SUT VIC AB 3-0 SH 27X BRD (SUTURE) ×2 IMPLANT
SUT VIC AB 3-0 X1 27 (SUTURE) ×6 IMPLANT
SUT VICRYL 2 TP 1 (SUTURE) ×6 IMPLANT
SYR 20ML ECCENTRIC (SYRINGE) ×3 IMPLANT
SYR 5ML LUER SLIP (SYRINGE) ×3 IMPLANT
SYR CONTROL 10ML LL (SYRINGE) IMPLANT
SYSTEM SAHARA CHEST DRAIN ATS (WOUND CARE) ×3 IMPLANT
TAPE CLOTH SURG 4X10 WHT LF (GAUZE/BANDAGES/DRESSINGS) ×3 IMPLANT
TAPE UMBILICAL 1/8 X36 TWILL (MISCELLANEOUS) IMPLANT
TIP APPLICATOR SPRAY EXTEND 16 (VASCULAR PRODUCTS) IMPLANT
TOWEL OR 17X24 6PK STRL BLUE (TOWEL DISPOSABLE) ×6 IMPLANT
TOWEL OR 17X26 10 PK STRL BLUE (TOWEL DISPOSABLE) ×3 IMPLANT
TRAP SPECIMEN MUCOUS 40CC (MISCELLANEOUS) ×3 IMPLANT
TRAY FOLEY CATH 16FRSI W/METER (SET/KITS/TRAYS/PACK) ×3 IMPLANT
TUBE CONNECTING 12X1/4 (SUCTIONS) ×9 IMPLANT
TUNNELER SHEATH ON-Q 11GX8 DSP (PAIN MANAGEMENT) ×3 IMPLANT
WATER STERILE IRR 1000ML POUR (IV SOLUTION) ×9 IMPLANT
YANKAUER SUCT BULB TIP NO VENT (SUCTIONS) ×3 IMPLANT

## 2013-03-15 NOTE — Op Note (Signed)
CARDIOTHORACIC SURGERY OPERATIVE NOTE 03/15/2013 Glen [name] [id number]  Surgeon:  Alleen Borne, MD  First Assistant: Gershon Crane, PA-C    Preoperative Diagnosis:  Right upper lobe lung nodules x 2  Postoperative Diagnosis:  Same   Procedure:  1.  Flexible video bronchoscopy 2.  Right video-assisted thoracoscopy 3.  Right muscle-sparing thoracotomy 4.  Right upper lobectomy 5.  Mediastinal lymph node dissection  Anesthesia:  General Endotracheal   Clinical History/Surgical Indication:  The patient is a 67 year old former smoker with COPD who lives in [place], [place] and was first evaluated by Dr. Artis Flock of [facility] on 02/01/2013. His pulmonary history dates back to 2011 when he developed some shortness of breath while hiking in [place]. Upon return to [place] he underwent workup including a CXR on 06/14/2009 that showed some linear streaking in the lung bases suggesting atelectasis or scarring. PFT's on 07/10/2009 at [facility] showed an FEV1 of 2.24 (63% predicted), and FVC of 5.27 (119%), and an FEV1/FVC of 42%. Diffusion capacity was 88%. There was no significant improvement in spirometry with bronchodilators. He was started on [facility] inhaler and in Feb 2014 he was started on Spiriva which he thinks improved his breathing. He enrolled in pulmonary rehab and did well. He says that he currently walks 3-4 miles per day but does get short of breath walking up hills. He denies cough and sputum production. He had a CXR done on 02/01/2013 which showed a question of a small mass at the right apex approximately 1.5 cm in size that was not seen on the prior exam. He underwent a CT of the chest on 02/08/2013 that showed a solid 13 mm nodule in the middle of the RUL that was suspicious for malignancy and an ill-defined spiculated density in the right lung apex that could be scarring or a second malignant nodule. There were significant emphysematous  changes in the lungs, particularly the upper lobes. A PET scan at [facility] on 02/18/2013 showed hypermetabolic activity in both right upper lobe nodules but no SUV was reported. There was a question of three mildly hypermetabolic foci in the region of the right hilum. There was a mildly hypermetabolic focus along the mesenteric side of a loop of small bowel which may be physiologic. He had repeat PFT's in the pulmonary medicine office that showed an FEV! Of 1.61 (45%) and FVC of 3.83 (82%) and a ratio of 42% consistent with severe obstructive lung disease. Compared to his previous results in 2011 there was a significant reduction in the FEV1 and FVC.  He did have a cardiac stress test in 2011 at [facility] that was reportedly negative for inducible ischemia.  He has two hypermetabolic nodules in the right upper lobe. The more central and solid nodule is highly suspicious for a lung cancer. The more ill-defined and spiculated nodule in the apex could be a synchronous primary cancer or an area of scar or inflammation. It is also possible that one of these is a primary and one is a metastasis. I don't see any other clear areas of hypermetabolism to suggest metastasis. The radiologist questioned the possibility of faint hypermetabolism in the right hilum but I really don't see this and there is no lymphadenopathy on the CT to correlate with this. There is no mediastinal adenopathy. His PFT's are borderline for right upper lobectomy although the RUL has significant bullous changes so it may not be contributing much to his lung function.  The more central and suspicious lung nodule would not be resectable without lobectomy. Both of these lesion would be amenable to SBRT but the prognosis for disease free survival would be significantly lower with XRT than with complete surgical removal. I discussed the options of surgery for right upper lobectomy vs XRT with the patient and his wife. We discussed the  benefits and risks of both treatment modalities and they clearly want to proceed with surgical removal. They understand the higher risk of lobectomy given his lung function and that he may have more exertional dyspnea postop. If we are going to treat this surgically I don't think a biopsy of the nodules is needed since a negative biopsy will not change things and he would be at significant risk of lung collapse with a biopsy that may require a chest tube and prolonged hospitalization. This would only increase his risk for lobectomy.  I reviewed the CT and PET scans with the patient and his wife, discussed the surgery of right thoracotomy and right upper lobectomy with them including alternatives, benefits and risks, including but not limited to bleeding, blood transfusion, infection, prolonged air leak, pleural space problems, respiratory or other organ failure, and death. I also discussed the possibility that the final pathology may show hilar or mediastinal lymph node mets requiring chemotherapy. They understand all of this and agree to proceed.     Preparation:  The patient was seen in the preoperative holding area and the correct patient, correct operation were confirmed with the patient after reviewing the medical record and xrays. The right side of the chest was signed by me. The consent was signed by me. Preoperative antibiotics were given. A radial arterial line was placed by the anesthesia team. The patient was taken back to the operating room and positioned supine on the operating room table. After being placed under general endotracheal anesthesia by the anesthesia team using a single lumen tube a foley catheter was placed. Lower extremity SCD's were placed. A time out was taken and the correct patient, operation and operative side were confirmed with nursing and anesthesia staff. Flexible bronchoscopy was performed as noted below. Then the single lumen tube was converted to a double lumen tube and  the patient was turned into the left lateral decubitus position with the right side up. The chest was prepped with betadine soap and solution and draped in the usual sterile manner. A surgical time-out was taken and the correct patient and operative procedure and operative side were confirmed with the nursing and anesthesia staff.   Flexible Bronchoscopy:  The video bronchoscope was passed down the endotracheal tube. The distal trachea was normal. The carina was sharp. The left bronchial tree had normal segmental anatomy with no endobronchial lesions or extrinsic compression. The right bronchial tree had normal segmental anatomy with no endobronchial lesions or extrinsic compression.  Right muscle-sparing thoracotomy:  The chest was entered through a lateral muscle-sparing thoracotomy incision. The pleural space was entered through the 4th ICS. The pleural space was unremarkable. There was a 2 firm, palpable masses in the right upper lobe.The fissures were both incomplete. There were firm palpable lymph nodes in the hilum. The mediastinal pleura was opened over the hilum. The right phrenic nerve was identified and avoided. The right upper lobe pulmonary artery branches were encircled with tapes and divided using vascular staplers. The upper lobe pulmonary vein was encircled with a tape and divided using a vascular stapler. The middle lobe branch of the right upper pulmonary  vein was preserved. The fissure was opened and the interlobar pulmonary artery identified. The posterior ascending branch was divided using a vascular stapler. The middle lobe pulmonary artery was preserved. The minor fissure was divided with several firings of a 60mm thick black stapler. The incomplete fissure between the upper and lower lobe posteriorly was divided using a 60 black thick stapler. A TA-30 stapler with 4.8 mm staples was passed around the right upper lobe bronchus and closed. The right lung was inflated and the middle  and lower lobes fully expanded. The stapler was fired and the bronchial stump transected distal to the stapler. The specimen was passed off the table and sent to pathology. Frozen section was not requested because it would not change anything that I did. The chest was filled with saline and the bronchial stump tested at 30 cm pressure. There was no air leak. The raw lung surface in the fissure was coated with Coseal. The subcarinal space was opened and no lymph nodes were seen. The mediastinal pleura was opened along the trachea and a 4R paratracheal lymph node was removed. No other paratracheal lymph nodes were seen. Hemostasis was complete.    Completion:  An On-Q pain catheter was placed through a stab incision in the chest wall and advanced in a subpleural location from the 6th ICS to the 4th ICS posteriorly. A 28 F chest tube and a 35 F Blake drain were placed in the pleural space posteriorly and anteriorly. The ribs were reapproximated with # 2 vicryl pericostal sutures with the sutures placed through holes drilled in the 5th rib and placed around the 4th rib. The muscles were returned to their normal anatomic position. The subcutaneous tissue was closed with 2-0 vicryl suture and the skin with 3-0 vicryl subcuticular suture. The sponge, needle, and instrument counts were correct according to the nurses. Dry sterile dressings were applied and the chest tubes were connected to pleurevac suction. The patient was turned into the supine position, extubated, and transferred to the PACU in satisfactory and stable condition.

## 2013-03-15 NOTE — Progress Notes (Signed)
Dr. Noreene Larsson in to PACU . Updated on paitent's oxygen requirements and inability to wean to nasal cannula. Currently on simple mask at 6 lpm. Ok for patient to transfer from PACU to nursing unit with current oxygen requirements.

## 2013-03-15 NOTE — Anesthesia Procedure Notes (Signed)
Procedure Name: Intubation Date/Time: 03/15/2013 11:29 AM Performed by: Brien Mates D Pre-anesthesia Checklist: Emergency Drugs available, Suction available, Timeout performed, Patient being monitored and Patient identified Patient Re-evaluated:Patient Re-evaluated prior to inductionOxygen Delivery Method: Circle system utilized Preoxygenation: Pre-oxygenation with 100% oxygen Intubation Type: IV induction Ventilation: Mask ventilation without difficulty and Oral airway inserted - appropriate to patient size Laryngoscope Size: Mac and 4 Grade View: Grade I Tube type: Oral Tube size: 8.5 mm Number of attempts: 1 Airway Equipment and Method: Stylet Placement Confirmation: ETT inserted through vocal cords under direct vision,  positive ETCO2 and breath sounds checked- equal and bilateral Secured at: 22 cm Tube secured with: Tape Dental Injury: Teeth and Oropharynx as per pre-operative assessment

## 2013-03-15 NOTE — Transfer of Care (Signed)
Immediate Anesthesia Transfer of Care Note  Patient: Glen [name] Apgar  Procedure(s) Performed: Procedure(s): VIDEO BRONCHOSCOPY (N/A) Right THORACOTOMY/LOBECTOMY (Right)  Patient Location: PACU  Anesthesia Type:General  Level of Consciousness: awake  Airway & Oxygen Therapy: Patient Spontanous Breathing and Patient connected to face mask oxygen  Post-op Assessment: Report given to PACU RN and Post -op Vital signs reviewed and stable  Post vital signs: Reviewed and stable  Complications: No apparent anesthesia complications

## 2013-03-15 NOTE — Anesthesia Preprocedure Evaluation (Addendum)
Anesthesia Evaluation  Patient identified by MRN, date of birth, ID band Patient awake    Reviewed: Allergy & Precautions, H&P , NPO status , Patient's Chart, lab work & pertinent test results  Airway Mallampati: II TM Distance: >3 FB Neck ROM: Full    Dental  (+) Edentulous Upper and Edentulous Lower   Pulmonary COPD COPD inhaler, former smoker,  breath sounds clear to auscultation        Cardiovascular Rhythm:Regular Rate:Normal     Neuro/Psych    GI/Hepatic GERD-  ,  Endo/Other    Renal/GU      Musculoskeletal   Abdominal   Peds  Hematology   Anesthesia Other Findings   Reproductive/Obstetrics                          Anesthesia Physical Anesthesia Plan  ASA: III  Anesthesia Plan: General   Post-op Pain Management:    Induction: Intravenous  Airway Management Planned: Double Lumen EBT  Additional Equipment: Arterial line and CVP  Intra-op Plan:   Post-operative Plan: Extubation in OR and Post-operative intubation/ventilation  Informed Consent: I have reviewed the patients History and Physical, chart, labs and discussed the procedure including the risks, benefits and alternatives for the proposed anesthesia with the patient or authorized representative who has indicated his/her understanding and acceptance.   Dental advisory given  Plan Discussed with: Anesthesiologist  Anesthesia Plan Comments:        Anesthesia Quick Evaluation

## 2013-03-15 NOTE — Anesthesia Postprocedure Evaluation (Signed)
  Anesthesia Post-op Note  Patient: Glen [name]  Procedure(s) Performed: Procedure(s): VIDEO BRONCHOSCOPY (N/A) Right THORACOTOMY/LOBECTOMY (Right)  Patient Location: PACU  Anesthesia Type:General  Level of Consciousness: awake, alert  and oriented  Airway and Oxygen Therapy: Patient Spontanous Breathing and Patient connected to nasal cannula oxygen  Post-op Pain: mild  Post-op Assessment: Post-op Vital signs reviewed, Patient's Cardiovascular Status Stable, Respiratory Function Stable, Patent Airway and Pain level controlled  Post-op Vital Signs: stable  Complications: No apparent anesthesia complications

## 2013-03-15 NOTE — Progress Notes (Signed)
S/p Right upper lobectomy  BP 122/62  Pulse 92  Temp(Src) 96.9 F (36.1 C) (Oral)  Resp 16  Ht 5\' 11"  (1.803 m)  Wt 160 lb (72.576 kg)  BMI 22.33 kg/m2  SpO2 97%   Intake/Output Summary (Last 24 hours) at 03/15/13 1809 Last data filed at 03/15/13 1500  Gross per 24 hour  Intake   1500 ml  Output    375 ml  Net   1125 ml    Just arrived from PACU, looks good  Continue current care

## 2013-03-15 NOTE — Brief Op Note (Signed)
03/15/2013  3:07 PM  PATIENT:  Glen [name]  67 y.o. male  PRE-OPERATIVE DIAGNOSIS:  RUL LUNG NODULE x 2  POST-OPERATIVE DIAGNOSIS:  RUL LUNG NODULE x 2  PROCEDURE:  Procedure(s): VIDEO BRONCHOSCOPY (N/A) Right video-assisted thoracoscopy Right Thoracotomy and right upper lobectomy  SURGEON:  Surgeon(s) and Role:    * Alleen Borne, MD - Primary  PHYSICIAN ASSISTANT: Gershon Crane, PA-C   ANESTHESIA:   general  EBL:  Total I/O In: 1500 [I.V.:1500] Out: 375 [Urine:275; Blood:100]  BLOOD ADMINISTERED:none  DRAINS: 1 28 F chest tube and 1 32 F Blake drain   LOCAL MEDICATIONS USED:  NONE  SPECIMEN:  Source of Specimen:  1. Right upper lobe  2. 10 R lymph node  3. 4R lymph node.  DISPOSITION OF SPECIMEN:  PATHOLOGY  COUNTS:  YES   DICTATION: .Note written in EPIC  PLAN OF CARE: Admit to inpatient   PATIENT DISPOSITION:  PACU - hemodynamically stable.   Delay start of Pharmacological VTE agent (>24hrs) due to surgical blood loss or risk of bleeding: yes

## 2013-03-15 NOTE — Interval H&P Note (Signed)
History and Physical Interval Note:  03/15/2013 10:46 AM  Glen [name]  has presented today for surgery, with the diagnosis of RUL LUNG NODULE  The various methods of treatment have been discussed with the patient and family. After consideration of risks, benefits and other options for treatment, the patient has consented to  Procedure(s): VIDEO BRONCHOSCOPY (N/A) THORACOTOMY/LOBECTOMY (Right) as a surgical intervention .  The patient's history has been reviewed, patient examined, no change in status, stable for surgery.  I have reviewed the patient's chart and labs.  Questions were answered to the patient's satisfaction.     Alleen Borne

## 2013-03-16 ENCOUNTER — Inpatient Hospital Stay (HOSPITAL_COMMUNITY): Payer: Medicare Other

## 2013-03-16 ENCOUNTER — Encounter (HOSPITAL_COMMUNITY): Payer: Self-pay | Admitting: Surgery

## 2013-03-16 LAB — BASIC METABOLIC PANEL
BUN: 11 mg/dL (ref 6–23)
Calcium: 8.1 mg/dL — ABNORMAL LOW (ref 8.4–10.5)
Chloride: 108 mEq/L (ref 96–112)
Creatinine, Ser: 0.79 mg/dL (ref 0.50–1.35)
GFR calc Af Amer: >90 mL/min (ref >90)
GFR calc non Af Amer: 90 mL/min — ABNORMAL LOW (ref >90)
Potassium: 4.3 mEq/L (ref 3.7–5.3)

## 2013-03-16 LAB — CBC
HCT: 45 % (ref 39.0–52.0)
MCH: 33.1 pg (ref 26.0–34.0)
MCHC: 33.6 g/dL (ref 30.0–36.0)
MCV: 98.7 fL (ref 78.0–100.0)
Platelets: 183 10*3/uL (ref 150–400)
RDW: 14.1 % (ref 11.5–15.5)
WBC: 12 10*3/uL — ABNORMAL HIGH (ref 4.0–10.5)

## 2013-03-16 LAB — POCT I-STAT 3, ART BLOOD GAS (G3+)
Bicarbonate: 23.8 mEq/L (ref 20.0–24.0)
O2 Saturation: 96 %
Patient temperature: 98.3
TCO2: 25 mmol/L (ref 0–100)
pH, Arterial: 7.315 — ABNORMAL LOW (ref 7.350–7.450)
pO2, Arterial: 89 mmHg (ref 80.0–100.0)

## 2013-03-16 MED ORDER — FLUTICASONE FUROATE-VILANTEROL 100-25 MCG/INH IN AEPB: 1.0000 | INHALATION_SPRAY | Freq: Every day | RESPIRATORY_TRACT | Status: DC

## 2013-03-16 MED ORDER — FLUTICASONE FUROATE-VILANTEROL 100-25 MCG/INH IN AEPB
1.0000 | INHALATION_SPRAY | Freq: Every day | RESPIRATORY_TRACT | Status: DC
  Administered 2013-03-16 – 2013-03-29 (×7): 1 via RESPIRATORY_TRACT
  Filled 2013-03-16 (×2): qty 1

## 2013-03-16 MED ORDER — KETOROLAC TROMETHAMINE 15 MG/ML IJ SOLN
15.0000 mg | Freq: Four times a day (QID) | INTRAMUSCULAR | Status: AC
  Administered 2013-03-16 – 2013-03-21 (×20): 15 mg via INTRAVENOUS
  Filled 2013-03-16 (×24): qty 1

## 2013-03-16 NOTE — Progress Notes (Signed)
1 Day Post-Op Procedure(s) (LRB): VIDEO BRONCHOSCOPY (N/A) Right THORACOTOMY/LOBECTOMY (Right) Subjective: Chest wall pain and hoarse voice  Objective: Vital signs in last 24 hours: Temp:  [96.9 F (36.1 C)-98.4 F (36.9 C)] 98.3 F (36.8 C) (12/31 0400) Pulse Rate:  [67-105] 88 (12/31 0600) Cardiac Rhythm:  [-] Normal sinus rhythm (12/31 0600) Resp:  [10-23] 22 (12/31 0600) BP: (106-126)/(47-80) 125/79 mmHg (12/31 0600) SpO2:  [79 %-100 %] 97 % (12/31 0600) Arterial Line BP: (109-164)/(55-78) 137/66 mmHg (12/31 0600) Weight:  [72.8 kg (160 lb 7.9 oz)] 72.8 kg (160 lb 7.9 oz) (12/31 0600)  Hemodynamic parameters for last 24 hours:    Intake/Output from previous day: 12/30 0701 - 12/31 0700 In: 2618.3 [I.V.:2568.3; IV Piggyback:50] Out: 1170 [Urine:740; Blood:100; Chest Tube:330] Intake/Output this shift:    General appearance: alert and cooperative Neurologic: intact Heart: regular rate and rhythm, S1, S2 normal, no murmur, click, rub or gallop Lungs: clear to auscultation bilaterally and air leak audible on the right chest tube with moderate air leak 3-4 chamber  Lab Results:  Recent Labs  03/15/13 2000 03/16/13 0429  WBC 15.5* 12.0*  HGB 15.5 15.1  HCT 45.3 45.0  PLT 188 183   BMET:  Recent Labs  03/15/13 2000 03/16/13 0429  NA 142 141  [name] 4.2 4.3  CL 107 108  CO2 22 24  GLUCOSE 120* 130*  BUN 12 11  CREATININE 0.78 0.79  CALCIUM 8.3* 8.1*    PT/INR:  Recent Labs  03/15/13 0913  LABPROT 13.7  INR 1.07   ABG    Component Value Date/Time   PHART 7.315* 03/16/2013 0424   HCO3 23.8 03/16/2013 0424   TCO2 25 03/16/2013 0424   ACIDBASEDEF 3.0* 03/16/2013 0424   O2SAT 96.0 03/16/2013 0424   CBG (last 3)  No results found for this basename: GLUCAP,  in the last 72 hours  Assessment/Plan: S/P Procedure(s) (LRB): VIDEO BRONCHOSCOPY (N/A) Right THORACOTOMY/LOBECTOMY (Right) He has been hemodynamically stable Moderate air leak probably  secondary to staple lines and pexing sutures. There is a small apical space. Will keep chest tubes to 10 cm suction. Mobilize, work on IS. He has moderate COPD. Continue foley due to patient in ICU and urinary output monitoring   LOS: 1 day    Glen [name] 03/16/2013

## 2013-03-16 NOTE — Progress Notes (Signed)
Patient ID: Glen [name], male   DOB: 03/02/1946, 67 y.o.   MRN: [id number]  SICU Evening Rounds:  Hemodynamically stable  Chest tube air leak stable  Pain under adequate control.

## 2013-03-17 ENCOUNTER — Inpatient Hospital Stay (HOSPITAL_COMMUNITY): Payer: Medicare Other

## 2013-03-17 NOTE — Progress Notes (Signed)
2 Days Post-Op Procedure(s) (LRB): VIDEO BRONCHOSCOPY (N/A) Right THORACOTOMY/LOBECTOMY (Right) Subjective: Sore but better than yesterday  Objective: Vital signs in last 24 hours: Temp:  [97.3 F (36.3 C)-98.4 F (36.9 C)] 98.4 F (36.9 C) (01/01 0814) Pulse Rate:  [72-88] 80 (01/01 0800) Cardiac Rhythm:  [-] Normal sinus rhythm (01/01 0800) Resp:  [11-23] 21 (01/01 0822) BP: (94-122)/(46-86) 103/57 mmHg (01/01 0800) SpO2:  [96 %-99 %] 99 % (01/01 0838) FiO2 (%):  [0 %] 0 % (12/31 1159) Weight:  [74.2 kg (163 lb 9.3 oz)] 74.2 kg (163 lb 9.3 oz) (01/01 0600)  Hemodynamic parameters for last 24 hours:    Intake/Output from previous day: 12/31 0701 - 01/01 0700 In: 1982.5 [P.O.:700; I.V.:1232.5; IV Piggyback:50] Out: 2055 [Urine:1590; Chest Tube:465] Intake/Output this shift: Total I/O In: 50 [I.V.:50] Out: 75 [Urine:75]  General appearance: alert and cooperative Heart: regular rate and rhythm, S1, S2 normal, no murmur, click, rub or gallop Lungs: clear to auscultation bilaterally 3 chamber air leak  Lab Results:  Recent Labs  03/15/13 2000 03/16/13 0429  WBC 15.5* 12.0*  HGB 15.5 15.1  HCT 45.3 45.0  PLT 188 183   BMET:  Recent Labs  03/15/13 2000 03/16/13 0429  NA 142 141  K 4.2 4.3  CL 107 108  CO2 22 24  GLUCOSE 120* 130*  BUN 12 11  CREATININE 0.78 0.79  CALCIUM 8.3* 8.1*    PT/INR:  Recent Labs  03/15/13 0913  LABPROT 13.7  INR 1.07   ABG    Component Value Date/Time   PHART 7.315* 03/16/2013 0424   HCO3 23.8 03/16/2013 0424   TCO2 25 03/16/2013 0424   ACIDBASEDEF 3.0* 03/16/2013 0424   O2SAT 96.0 03/16/2013 0424   CBG (last 3)  No results found for this basename: GLUCAP,  in the last 72 hours  CXR:  Small right basilar ptx or space  Assessment/Plan: S/P Procedure(s) (LRB): VIDEO BRONCHOSCOPY (N/A) Right THORACOTOMY/LOBECTOMY (Right) He is doing well overall. He still has an air leak but less so will put to water seal and  observe. Continue IS and ambulation Pathology pending Continue foley due to patient in ICU and urinary output monitoring   LOS: 2 days    Glen [name] K 03/17/2013

## 2013-03-17 NOTE — Progress Notes (Signed)
Pt developed subcutaneous air in right arm, up and down right side of chest and back. MD aware, orders to place pt back on -21mm suction. MD updated on pt urine output from previous shift. Will continue to monitor.

## 2013-03-18 ENCOUNTER — Inpatient Hospital Stay (HOSPITAL_COMMUNITY): Payer: Medicare Other

## 2013-03-18 LAB — BASIC METABOLIC PANEL
BUN: 17 mg/dL (ref 6–23)
CO2: 27 mEq/L (ref 19–32)
Calcium: 8.4 mg/dL (ref 8.4–10.5)
Chloride: 102 mEq/L (ref 96–112)
Creatinine, Ser: 0.74 mg/dL (ref 0.50–1.35)
GFR calc Af Amer: >90 mL/min (ref >90)
Glucose, Bld: 121 mg/dL — ABNORMAL HIGH (ref 70–99)
POTASSIUM: 4.7 meq/L (ref 3.7–5.3)
SODIUM: 137 meq/L (ref 137–147)

## 2013-03-18 NOTE — Discharge Summary (Signed)
Physician Discharge Summary       [address]       [place] [address]             [phone]    Patient ID: Glen [name] MRN: [id number] DOB/AGE: [birth year] 68 y.o.  Admit date: 03/15/2013 Discharge date: 03/29/2013  Admission Diagnoses: 1. Right upper lung nodules 2.History of COPD and emphysema 3.History of tobacco abuse 4.History of GERD 5.History of BPH 6.History of DJD  Discharge Diagnoses:  1. Right upper lung nodules 2.History of COPD and emphysema 3.History of tobacco abuse 4.History of GERD 5.History of BPH 6.History of DJD  Procedure (s):  1. Flexible video bronchoscopy  2. Right video-assisted thoracoscopy  3. Right muscle-sparing thoracotomy  4. Right upper lobectomy  5. Mediastinal lymph node dissection by Dr. Cyndia Bent on 03/15/2013.  Pathology:  1. 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 2. Lung, resection (segmental or lobe), Right upper lobe - INVASIVE POORLY DIFFERENTIATED ADENOSQUAMOUS CARCINOMA, 1.7 CM, INVOLVING PLEURA. - INVASIVE POORLY DIFFERENTIATED SQUAMOUS CELL CARCINOMA, 2.2 CM. - THREE LYMPH NODES, NEGATIVE FOR METASTATIC CARCINOMA (0/3). - RESECTION MARGIN, NEGATIVE FOR MALIGNANCY. - PLEASE SEE ONCOLOGY TEMPLATE FOR DETAILS. 3. Lymph node, biopsy, 10 R #2 - ONE LYMPH NODE, NEGATIVE FOR METASTATIC CARCINOMA (0/1). 4. Lymph node, biopsy, 4 R - ONE LYMPH NODE, NEGATIVE FOR METASTATIC CARCINOMA (0/1). TNM code: m pT2, pN1  History of Presenting Illness: The patient is a 68 year old former smoker with COPD who lives in [place], [place] and was first evaluated by Dr. Rogers Blocker of [facility] on 02/01/2013. His pulmonary history dates back to 2011 when he developed some shortness of breath while hiking in [place]. Upon return to [place] he underwent workup including a CXR on 06/14/2009 that showed some linear streaking in  the lung bases suggesting atelectasis or scarring. PFT's on 07/10/2009 at [facility] showed an FEV1 of 2.24 (63% predicted), and FVC of 5.27 (119%), and an FEV1/FVC of 42%. Diffusion capacity was 88%. There was no significant improvement in spirometry with bronchodilators. He was started on ProAir [facility] inhaler and in Feb 2014 he was started on Spiriva which he thinks improved his breathing. He enrolled in pulmonary rehab and did well. He says that he currently walks 3-4 miles per day but does get short of breath walking up hills. He denies cough and sputum production. He had a CXR done on 02/01/2013 which showed a question of a small mass at the right apex approximately 1.5 cm in size that was not seen on the prior exam. He underwent a CT of the chest on 02/08/2013 that showed a solid 13 mm nodule in the middle of the RUL that was suspicious for malignancy and an ill-defined spiculated density in the right lung apex that could be scarring or a second malignant nodule. There were significant emphysematous changes in the lungs, particularly the upper lobes. A PET scan at [facility] on 02/18/2013 showed hypermetabolic activity in both right upper lobe nodules but no SUV was reported. There was a question of three mildly hypermetabolic foci in the region of the right hilum. There was a mildly hypermetabolic focus along the mesenteric side of a loop of small bowel which may be physiologic. He had repeat PFT's in the pulmonary medicine office that showed an FEV1 Of 1.61 (45%) and FVC of 3.83 (82%) and a ratio of 42% consistent with severe obstructive lung  disease. Compared to his previous results in 2011 there was a significant reduction in the FEV1 and FVC. Dr. Cyndia Bent discussed the need for bronchoscopy and right lung surgery. Potential risks, benefits, and complications were discussed with the patient and he agreed to proceed with surgery.   Brief Hospital Course:  He remained afebrile and hemodynamically  stable. His a line was removed early in his post operative course.The foley did remain for a few days and then was removed. He was volume overloaded and diuresed accordingly. Daily chest x rays were obtained and remained stable. He did have an air leak post op. Chest tubes were placed to water seal on 1/1. He then developed subcutaneous emphysema, perhaps secondary to kinking of the chest tube. Chest tubes were placed back to 10 cm of suction. His chest x ray then showed an increase in the right pneumothorax so suction was increased to 20 cm. Chest x ray was improved the following day. One chest tube was removed. His remaining chest tube continues with a small air leak and was to water seal. CXR remains stable.  He was felt surgically stable for transfer from the ICU to 2000 for further convalescence on 03/20/2013.He has been tolerating a diet and has had a bowel movement. He is ambulating on room air. Pathology results showed he will need an oncology follow up for adjuvant therapy after discharge.His chest tube was placed to  a mini express. Dr. Cyndia Bent has seen and evaluated the patient and feels he is surgically stable for discharge. The patient will be seen this Thursday by Dr. Cyndia Bent in the office. Home health will be arranged to change the chest tube dressing daily.  Latest Vital Signs: Blood pressure 99/59, pulse 77, temperature 98.9 F (37.2 C), temperature source Oral, resp. rate 20, height 5\' 11"  (1.803 m), weight 70.1 kg (154 lb 8.7 oz), SpO2 94.00%.  Physical Exam: Cardiovascular: RRR  Pulmonary: Diminished at right apex; left lung clear; no rales, wheezes, or rhonchi.  Abdomen: Soft, non tender, bowel sounds present.  Extremities: Mild bilateral lower extremity edema.  Wounds: Clean and dry. No erythema or signs of infection.  Chest Tube: to mini express, air leak present  Discharge Condition:Stable  Recent laboratory studies:  Lab Results  Component Value Date   WBC 8.3 03/23/2013    HGB 13.7 03/23/2013   HCT 40.4 03/23/2013   MCV 97.1 03/23/2013   PLT 259 03/23/2013   Lab Results  Component Value Date   NA 139 03/23/2013   K 4.5 03/23/2013   CL 103 03/23/2013   CO2 26 03/23/2013   CREATININE 0.76 03/23/2013   GLUCOSE 89 03/23/2013     Diagnostic Studies:  EXAM:  PORTABLE CHEST - 1 VIEW  COMPARISON: 03/28/2013; 03/27/2013; 03/25/2013  FINDINGS:  Grossly unchanged cardiac silhouette and mediastinal contours with atherosclerotic plaque within the thoracic aorta. Stable postsurgical change of the right hilum. Grossly unchanged moderate-sized right-sided pneumothorax which is again noted to contain a basilar component. Stable positioning of a right-sided atypically directed chest tube. Slight worsening of bilateral perihilar opacities favore to represent atelectasis. No definite pleural effusion, though note, the right costophrenic angle is excluded from view. The subcutaneous emphysema about the right  lateral chest wall is grossly unchanged. Unchanged bones.  IMPRESSION:  1. Stable positioning of support apparatus. Grossly unchanged moderate-sized right-sided pneumothorax.  2. Stable postsurgical change of the right lung with slight  worsening of perihilar atelectasis.  Electronically Signed  By: Sandi Mariscal M.D.  On: 03/29/2013 07:57  Future Appointments Provider Department Dept Phone   03/31/2013 2:30 PM Gaye Pollack, MD [facility] [place] [phone]   04/06/2013 11:30 AM Gaye Pollack, MD [facility] [phone]      Discharge Medications:   Medication List         BREO ELLIPTA 100-25 MCG/INH Aepb  Generic drug:  Fluticasone Furoate-Vilanterol  Inhale 1 puff into the lungs daily.     cromolyn 5.2 MG/ACT nasal spray  Commonly known as:  NASALCROM  Place 1 spray into both nostrils daily.     omeprazole 20 MG capsule  Commonly known as:  PRILOSEC  Take 20 mg by mouth 2 (two) times  daily before a meal.     oxyCODONE-acetaminophen 5-325 MG per tablet  Commonly known as:  PERCOCET/ROXICET  Take 1 tablet by mouth every 4 (four) hours as needed for severe pain.     PHILLIPS PO  Take 1 capsule by mouth daily.     tiotropium 18 MCG inhalation capsule  Commonly known as:  SPIRIVA  Place 18 mcg into inhaler and inhale daily.     Vitamin D 2000 UNITS Caps  Take 2,000 Units by mouth daily.        Follow Up Appointments: Follow-up Information   Follow up with Gaye Pollack, MD On 03/31/2013. (PA/LAT CXR to be taken (at [facility] which is in the same building as Dr. Vivi Martens office) on 03/31/2013 at 1:30 pm;Appointment with Dr. Cyndia Bent is on 03/31/2013 at 2:30 pm)    Specialty:  Cardiothoracic Surgery   Contact information:   [address] [place] [address] [phone]      Signed: Lars Pinks MPA-C 03/29/2013, 12:26 PM

## 2013-03-18 NOTE — Progress Notes (Signed)
13 cc PCA Dilaudid wasted in sink after PCA dc'd. Witnessed by [employer]

## 2013-03-18 NOTE — Progress Notes (Signed)
3 Days Post-Op Procedure(s) (LRB): VIDEO BRONCHOSCOPY (N/A) Right THORACOTOMY/LOBECTOMY (Right) Subjective:  No complaints  Objective: Vital signs in last 24 hours: Temp:  [98.3 F (36.8 C)-99.4 F (37.4 C)] 98.3 F (36.8 C) (01/02 0400) Pulse Rate:  [80-99] 80 (01/02 0600) Cardiac Rhythm:  [-] Normal sinus rhythm (01/02 0400) Resp:  [13-26] 17 (01/02 0600) BP: (45-121)/(18-73) 108/62 mmHg (01/02 0600) SpO2:  [94 %-99 %] 95 % (01/02 0600) Weight:  [75.2 kg (165 lb 12.6 oz)] 75.2 kg (165 lb 12.6 oz) (01/02 0100)  Hemodynamic parameters for last 24 hours:    Intake/Output from previous day: 01/01 0701 - 01/02 0700 In: 300.3 [I.V.:300.3] Out: 1010 [Urine:885; Chest Tube:125] Intake/Output this shift:    General appearance: alert and cooperative Heart: regular rate and rhythm, S1, S2 normal, no murmur, click, rub or gallop Lungs: clear to auscultation bilaterally Wound: dressing dry subcutaneous air over right chest. 3 chamber air leak from pleurevac.  Lab Results:  Recent Labs  03/15/13 2000 03/16/13 0429  WBC 15.5* 12.0*  HGB 15.5 15.1  HCT 45.3 45.0  PLT 188 183   BMET:  Recent Labs  03/16/13 0429 03/18/13 0435  NA 141 137  K 4.3 4.7  CL 108 102  CO2 24 27  GLUCOSE 130* 121*  BUN 11 17  CREATININE 0.79 0.74  CALCIUM 8.1* 8.4    PT/INR:  Recent Labs  03/15/13 0913  LABPROT 13.7  INR 1.07   ABG    Component Value Date/Time   PHART 7.315* 03/16/2013 0424   HCO3 23.8 03/16/2013 0424   TCO2 25 03/16/2013 0424   ACIDBASEDEF 3.0* 03/16/2013 0424   O2SAT 96.0 03/16/2013 0424   CBG (last 3)  No results found for this basename: GLUCAP,  in the last 72 hours  Assessment/Plan: S/P Procedure(s) (LRB): VIDEO BRONCHOSCOPY (N/A) Right THORACOTOMY/LOBECTOMY (Right)  He is doing well overall but still has an airleak. He developed subcutaneous air on water seal yesterday and I suspect it was because the tubing got kinked. Will continue to 10 cm  suction. CXR not done this am so will follow up on that.   LOS: 3 days    BARTLE,BRYAN K 03/18/2013

## 2013-03-18 NOTE — Progress Notes (Signed)
CT surgery p.m. Rounds   Resting comfortably up in chair Maintaining O2 sat duration greater than 94% Persistent moderate airleak from chest tubes Patient has stable day walked twice in the hallway Continue current care

## 2013-03-19 ENCOUNTER — Inpatient Hospital Stay (HOSPITAL_COMMUNITY): Payer: Medicare Other

## 2013-03-19 LAB — CBC
HCT: 39.9 % (ref 39.0–52.0)
Hemoglobin: 13.6 g/dL (ref 13.0–17.0)
MCH: 32.8 pg (ref 26.0–34.0)
MCHC: 34.1 g/dL (ref 30.0–36.0)
MCV: 96.1 fL (ref 78.0–100.0)
Platelets: 174 10*3/uL (ref 150–400)
RBC: 4.15 MIL/uL — ABNORMAL LOW (ref 4.22–5.81)
RDW: 13.4 % (ref 11.5–15.5)
WBC: 6.8 10*3/uL (ref 4.0–10.5)

## 2013-03-19 LAB — BASIC METABOLIC PANEL
BUN: 20 mg/dL (ref 6–23)
CO2: 29 mEq/L (ref 19–32)
Calcium: 8.6 mg/dL (ref 8.4–10.5)
Chloride: 103 mEq/L (ref 96–112)
Creatinine, Ser: 0.8 mg/dL (ref 0.50–1.35)
GFR calc Af Amer: >90 mL/min (ref >90)
GFR calc non Af Amer: 89 mL/min — ABNORMAL LOW (ref >90)
Glucose, Bld: 99 mg/dL (ref 70–99)
Potassium: 4.4 mEq/L (ref 3.7–5.3)
Sodium: 139 mEq/L (ref 137–147)

## 2013-03-19 MED ORDER — FUROSEMIDE 10 MG/ML IJ SOLN
20.0000 mg | Freq: Every day | INTRAMUSCULAR | Status: DC
  Administered 2013-03-19 – 2013-03-29 (×11): 20 mg via INTRAVENOUS
  Filled 2013-03-19 (×12): qty 2

## 2013-03-19 MED ORDER — ENSURE PO LIQD: 237.0000 mL | Freq: Three times a day (TID) | ORAL | Status: DC

## 2013-03-19 MED ORDER — TRAMADOL HCL 50 MG PO TABS
50.0000 mg | ORAL_TABLET | Freq: Four times a day (QID) | ORAL | Status: DC | PRN
  Administered 2013-03-21: 50 mg via ORAL
  Filled 2013-03-19: qty 1

## 2013-03-19 MED ORDER — ENSURE COMPLETE PO LIQD
237.0000 mL | Freq: Three times a day (TID) | ORAL | Status: DC
  Administered 2013-03-20 – 2013-03-29 (×17): 237 mL via ORAL

## 2013-03-19 NOTE — Progress Notes (Signed)
CT surgery p.m. Rounds  Waiting for transfer to set down but remained stable Weight up 7 pounds, serous drainage from chest tube site-will dose with Lasix and encourage oral protein intake  Leave tubes to 10 cm suction until a.m. then reassess

## 2013-03-19 NOTE — Progress Notes (Signed)
4 Days Post-Op Procedure(s) (LRB): VIDEO BRONCHOSCOPY (N/A) Right THORACOTOMY/LOBECTOMY (Right) Subjective: Status post right upper lobectomy with postoperative airleak, now somewhat improved Pulmonary status stable on 2 L nasal cannula Chest x-ray without significant pneumothorax, mild and stable subcutaneous emphysema on x-ray Pleur-evac shows titling of air-fluid level with 1-2 columns airleak with cough Pathology report pending Objective: Vital signs in last 24 hours: Temp:  [97.9 F (36.6 C)-98.8 F (37.1 C)] 98.3 F (36.8 C) (01/03 0729) Pulse Rate:  [73-99] 86 (01/03 1000) Cardiac Rhythm:  [-] Normal sinus rhythm (01/03 0800) Resp:  [11-24] 17 (01/03 1000) BP: (99-139)/(46-84) 113/65 mmHg (01/03 0900) SpO2:  [87 %-100 %] 98 % (01/03 1000) Weight:  [166 lb 0.1 oz (75.3 kg)] 166 lb 0.1 oz (75.3 kg) (01/03 0500)  Hemodynamic parameters for last 24 hours:   afebrile stable sinus rhythm  Intake/Output from previous day: 01/02 0701 - 01/03 0700 In: 560 [P.O.:560] Out: 1950 [Urine:1900; Chest Tube:50] Intake/Output this shift: Total I/O In: 240 [P.O.:240] Out: 80 [Chest Tube:80]  Exam Breath sounds clear Thoracotomy incision clean and dry  Lab Results:  Recent Labs  03/19/13 0410  WBC 6.8  HGB 13.6  HCT 39.9  PLT 174   BMET:  Recent Labs  03/18/13 0435 03/19/13 0410  NA 137 139  K 4.7 4.4  CL 102 103  CO2 27 29  GLUCOSE 121* 99  BUN 17 20  CREATININE 0.74 0.80  CALCIUM 8.4 8.6    PT/INR: No results found for this basename: LABPROT, INR,  in the last 72 hours ABG    Component Value Date/Time   PHART 7.315* 03/16/2013 0424   HCO3 23.8 03/16/2013 0424   TCO2 25 03/16/2013 0424   ACIDBASEDEF 3.0* 03/16/2013 0424   O2SAT 96.0 03/16/2013 0424   CBG (last 3)  No results found for this basename: GLUCAP,  in the last 72 hours  Assessment/Plan: S/P Procedure(s) (LRB): VIDEO BRONCHOSCOPY (N/A) Right THORACOTOMY/LOBECTOMY (Right)  Plan Continue  both chest tubes 10 cm suction  another day Transfer to step down 3 south   LOS: 4 days    VAN TRIGT III,Maheen Cwikla 03/19/2013

## 2013-03-20 ENCOUNTER — Inpatient Hospital Stay (HOSPITAL_COMMUNITY): Payer: Medicare Other

## 2013-03-20 LAB — BASIC METABOLIC PANEL
BUN: 19 mg/dL (ref 6–23)
CO2: 30 mEq/L (ref 19–32)
Calcium: 8.8 mg/dL (ref 8.4–10.5)
Chloride: 103 mEq/L (ref 96–112)
Creatinine, Ser: 0.8 mg/dL (ref 0.50–1.35)
GFR calc Af Amer: >90 mL/min (ref >90)
GFR calc non Af Amer: 89 mL/min — ABNORMAL LOW (ref >90)
Glucose, Bld: 97 mg/dL (ref 70–99)
Potassium: 4.4 mEq/L (ref 3.7–5.3)
Sodium: 141 mEq/L (ref 137–147)

## 2013-03-20 LAB — CBC
HCT: 37.8 % — ABNORMAL LOW (ref 39.0–52.0)
Hemoglobin: 13.3 g/dL (ref 13.0–17.0)
MCH: 33.8 pg (ref 26.0–34.0)
MCHC: 35.2 g/dL (ref 30.0–36.0)
MCV: 95.9 fL (ref 78.0–100.0)
Platelets: 184 10*3/uL (ref 150–400)
RBC: 3.94 MIL/uL — ABNORMAL LOW (ref 4.22–5.81)
RDW: 13.4 % (ref 11.5–15.5)
WBC: 6.9 10*3/uL (ref 4.0–10.5)

## 2013-03-20 MED ORDER — LACTULOSE 10 GM/15ML PO SOLN
30.0000 g | Freq: Every day | ORAL | Status: AC
  Administered 2013-03-20: 30 g via ORAL
  Filled 2013-03-20: qty 45

## 2013-03-20 NOTE — Progress Notes (Signed)
Pt transferred to 2 West bed 22.  Pt alert and oriented, traveled on the monitor, RA 02 93 percent.  Wife notified of transfer.  Pt confirms all belongings available in new room including dentures, glasses, hearing aides, all chargers, tablet, cellphone present on new room bedside table.  Report given to [employer].

## 2013-03-20 NOTE — Progress Notes (Signed)
5 Days Post-Op Procedure(s) (LRB): VIDEO BRONCHOSCOPY (N/A) Right THORACOTOMY/LOBECTOMY (Right) Subjective: Status post right upper lobectomy for malignancy Persistent air leak through both chest tubes Chest x-ray this a.m. shows increased base/pneumothorax on right Will increase suction on tubes the 20 cm suction Patient stable and ready for transfer to step down Pain well-controlled and surgical incision healing well  Objective: Vital signs in last 24 hours: Temp:  [97.7 F (36.5 C)-98.2 F (36.8 C)] 98.1 F (36.7 C) (01/04 0800) Pulse Rate:  [41-101] 91 (01/04 1000) Cardiac Rhythm:  [-] Normal sinus rhythm (01/04 0855) Resp:  [11-23] 17 (01/04 1000) BP: (103-136)/(51-72) 120/61 mmHg (01/04 1000) SpO2:  [64 %-100 %] 87 % (01/04 1000) Weight:  [165 lb 9.1 oz (75.1 kg)] 165 lb 9.1 oz (75.1 kg) (01/04 0600)  Hemodynamic parameters for last 24 hours:   afebrile sinus rhythm  Intake/Output from previous day: 01/03 0701 - 01/04 0700 In: 490 [P.O.:490] Out: 1425 [Urine:1275; Chest Tube:150] Intake/Output this shift: Total I/O In: 240 [P.O.:240] Out: 0   Exam 2 column airleak and Pleur-evac Breath sounds slightly diminished on right  Lab Results:  Recent Labs  03/19/13 0410 03/20/13 0400  WBC 6.8 6.9  HGB 13.6 13.3  HCT 39.9 37.8*  PLT 174 184   BMET:  Recent Labs  03/19/13 0410 03/20/13 0400  NA 139 141  K 4.4 4.4  CL 103 103  CO2 29 30  GLUCOSE 99 97  BUN 20 19  CREATININE 0.80 0.80  CALCIUM 8.6 8.8    PT/INR: No results found for this basename: LABPROT, INR,  in the last 72 hours ABG    Component Value Date/Time   PHART 7.315* 03/16/2013 0424   HCO3 23.8 03/16/2013 0424   TCO2 25 03/16/2013 0424   ACIDBASEDEF 3.0* 03/16/2013 0424   O2SAT 96.0 03/16/2013 0424   CBG (last 3)  No results found for this basename: GLUCAP,  in the last 72 hours  Assessment/Plan: S/P Procedure(s) (LRB): VIDEO BRONCHOSCOPY (N/A) Right THORACOTOMY/LOBECTOMY  (Right)  Increase suction on Pleur-evac to 20 cm Transfer to step down 2 West circle   LOS: 5 days    VAN TRIGT [name],Glen Guedes 03/20/2013

## 2013-03-21 ENCOUNTER — Inpatient Hospital Stay (HOSPITAL_COMMUNITY): Payer: Medicare Other

## 2013-03-21 LAB — BASIC METABOLIC PANEL
BUN: 22 mg/dL (ref 6–23)
CO2: 28 mEq/L (ref 19–32)
Calcium: 9.1 mg/dL (ref 8.4–10.5)
Chloride: 103 mEq/L (ref 96–112)
Creatinine, Ser: 0.73 mg/dL (ref 0.50–1.35)
GFR calc Af Amer: >90 mL/min (ref >90)
GFR calc non Af Amer: >90 mL/min (ref >90)
Glucose, Bld: 98 mg/dL (ref 70–99)
Potassium: 4.7 mEq/L (ref 3.7–5.3)
Sodium: 143 mEq/L (ref 137–147)

## 2013-03-21 LAB — CBC
HCT: 42.8 % (ref 39.0–52.0)
Hemoglobin: 14.4 g/dL (ref 13.0–17.0)
MCH: 33 pg (ref 26.0–34.0)
MCHC: 33.6 g/dL (ref 30.0–36.0)
MCV: 97.9 fL (ref 78.0–100.0)
Platelets: 204 10*3/uL (ref 150–400)
RBC: 4.37 MIL/uL (ref 4.22–5.81)
RDW: 13.7 % (ref 11.5–15.5)
WBC: 7.7 10*3/uL (ref 4.0–10.5)

## 2013-03-21 MED ORDER — LEVALBUTEROL HCL 0.63 MG/3ML IN NEBU: 0.6300 mg | INHALATION_SOLUTION | Freq: Four times a day (QID) | RESPIRATORY_TRACT | Status: DC | PRN

## 2013-03-21 NOTE — Progress Notes (Signed)
Removed Bard soft chest drain per order Dr. Cyndia Bent.  Removed per hospital policy.  Remaining CT remains connected to 20 cm Pleuravac suction per order.  Patient tol procedure well.  PCXR pending.   RN Colletta Maryland present - to continue to monitor and call as needed.  Thoracotomy incision clean and intact - left open to air.  Few areas of skin tear protected with foam dressing.

## 2013-03-21 NOTE — Progress Notes (Signed)
03/21/2013 6:35 PM Nursing note Pt. Ambulated 250 ft with NT. Pt. Tolerated well. Encouraged further ambulation this evening.  Yahaira Bruski, Arville Lime

## 2013-03-21 NOTE — Progress Notes (Signed)
      [address]       [place] [address]             [phone]      6 Days Post-Op Procedure(s) (LRB): VIDEO BRONCHOSCOPY (N/A) Right THORACOTOMY/LOBECTOMY (Right)  Subjective:  Glen [name] has multiple complaints this morning.  He states this is the worst he has ever felt.  He complains of pain, nausea, inability to eat or sleep.   Objective: Vital signs in last 24 hours: Temp:  [97.7 F (36.5 C)-98.5 F (36.9 C)] 98 F (36.7 C) (01/05 0426) Pulse Rate:  [41-96] 78 (01/05 0426) Cardiac Rhythm:  [-] Normal sinus rhythm;Bundle branch block (01/04 1930) Resp:  [17-23] 18 (01/05 0426) BP: (98-125)/(48-70) 120/48 mmHg (01/05 0426) SpO2:  [64 %-99 %] 99 % (01/05 0426)  Intake/Output from previous day: 01/04 0701 - 01/05 0700 In: 477 [P.O.:477] Out: 651 [Urine:550; Stool:1; Chest Tube:100]  General appearance: alert, cooperative and no distress Heart: regular rate and rhythm Lungs: diminished breath sounds on right Abdomen: soft, non-tender; bowel sounds normal; no masses,  no organomegaly Extremities: extremities normal, atraumatic, no cyanosis or edema Wound: clean and dry  Lab Results:  Recent Labs  03/20/13 0400 03/21/13 0523  WBC 6.9 7.7  HGB 13.3 14.4  HCT 37.8* 42.8  PLT 184 204   BMET:  Recent Labs  03/20/13 0400 03/21/13 0523  NA 141 143  K 4.4 4.7  CL 103 103  CO2 30 28  GLUCOSE 97 98  BUN 19 22  CREATININE 0.80 0.73  CALCIUM 8.8 9.1    PT/INR: No results found for this basename: LABPROT, INR,  in the last 72 hours ABG    Component Value Date/Time   PHART 7.315* 03/16/2013 0424   HCO3 23.8 03/16/2013 0424   TCO2 25 03/16/2013 0424   ACIDBASEDEF 3.0* 03/16/2013 0424   O2SAT 96.0 03/16/2013 0424   CBG (last 3)  No results found for this basename: GLUCAP,  in the last 72 hours  Assessment/Plan: S/P Procedure(s) (LRB): VIDEO BRONCHOSCOPY (N/A) Right THORACOTOMY/LOBECTOMY (Right)  1. Chest tube- + air leak, chest  tubes remain on suction- CXR with continued sub q emphysema, basilar pneumothorax 2. GI- + nausea, will add Zofran 3. Pain control- using Percocet, has toradol and Ultram order, will speak with nurse and see if addition of Toradol can help with pain 4. Renal- creatinine WNL, weight is up from admission on lasix 5. Dispo-leave chest tubes to suction   LOS: 6 days    Ellwood Handler 03/21/2013

## 2013-03-22 ENCOUNTER — Inpatient Hospital Stay (HOSPITAL_COMMUNITY): Payer: Medicare Other

## 2013-03-22 NOTE — Progress Notes (Addendum)
[address]       [employer] [address]             [phone]      7 Days Post-Op  Procedure(s) (LRB): VIDEO BRONCHOSCOPY (N/A) Right THORACOTOMY/LOBECTOMY (Right) Subjective: Feels better today  Objective  Telemetry sinus rhythm, occ pvc's  Temp:  [98.3 F (36.8 C)-98.4 F (36.9 C)] 98.3 F (36.8 C) (01/06 8921) Pulse Rate:  [77] 77 (01/06 0637) Resp:  [18] 18 (01/06 0637) BP: (115-129)/(51-74) 115/71 mmHg (01/06 0637) SpO2:  [91 %-93 %] 91 % (01/06 0637) Weight:  [158 lb 4.6 oz (71.8 kg)] 158 lb 4.6 oz (71.8 kg) (01/06 1941)   Intake/Output Summary (Last 24 hours) at 03/22/13 0740 Last data filed at 03/22/13 0645  Gross per 24 hour  Intake    720 ml  Output   1290 ml  Net   -570 ml       General appearance: alert, cooperative and no distress Heart: regular rate and rhythm Lungs: dim air movement in right base, takes shallow breaths Abdomen: benign Extremities: no edema Wound: incis healing well  Lab Results:  Recent Labs  03/20/13 0400 03/21/13 0523  NA 141 143  K 4.4 4.7  CL 103 103  CO2 30 28  GLUCOSE 97 98  BUN 19 22  CREATININE 0.80 0.73  CALCIUM 8.8 9.1   No results found for this basename: AST, ALT, ALKPHOS, BILITOT, PROT, ALBUMIN,  in the last 72 hours No results found for this basename: LIPASE, AMYLASE,  in the last 72 hours  Recent Labs  03/20/13 0400 03/21/13 0523  WBC 6.9 7.7  HGB 13.3 14.4  HCT 37.8* 42.8  MCV 95.9 97.9  PLT 184 204   No results found for this basename: CKTOTAL, CKMB, TROPONINI,  in the last 72 hours No components found with this basename: POCBNP,  No results found for this basename: DDIMER,  in the last 72 hours No results found for this basename: HGBA1C,  in the last 72 hours No results found for this basename: CHOL, HDL, LDLCALC, TRIG, CHOLHDL,  in the last 72 hours No results found for this basename: TSH, T4TOTAL, FREET3, T3FREE, THYROIDAB,  in the last 72 hours No results found  for this basename: VITAMINB12, FOLATE, FERRITIN, TIBC, IRON, RETICCTPCT,  in the last 72 hours  Medications: Scheduled . bisacodyl  10 mg Oral Daily  . feeding supplement (ENSURE COMPLETE)  237 mL Oral TID WC  . Fluticasone Furoate-Vilanterol  1 puff Inhalation Daily  . furosemide  20 mg Intravenous Daily  . tiotropium  18 mcg Inhalation Daily     Radiology/Studies:  Dg Chest Port 1 View  03/21/2013   CLINICAL DATA:  Post right upper lobectomy appear chest tube removal.  EXAM: PORTABLE CHEST - 1 VIEW  COMPARISON:  03/21/2013 6:36 a.m.  FINDINGS: One of 2 right-sided chest tubes is been removed. The very small right apical pneumothorax is smaller than on the prior exam.  Prominent right-sided subcutaneous emphysema and left supraclavicular subcutaneous emphysema once again noted.  Mild elevation right hemidiaphragm.  Postsurgical changes with fullness right hilar region unchanged.  Mild central pulmonary vascular prominence without pulmonary edema.  Heart size within normal limits.  IMPRESSION: One of 2 right-sided chest tubes is been removed. The very small right apical pneumothorax is smaller than on the prior exam.   Electronically Signed   By: Chauncey Cruel M.D.   On: 03/21/2013 15:56   Dg  Chest Port 1 View  03/21/2013   CLINICAL DATA:  Right upper lobe nodule resection  EXAM: PORTABLE CHEST - 1 VIEW  COMPARISON:  03/20/2013  FINDINGS: Two chest tubes remain on the right. Surgical clips right upper lobe. Improvement in right upper lobe pneumothorax which is now approximately 10%. Diffuse subcutaneous emphysema right-greater-than-left is unchanged.  No significant pleural effusion.  Left lung is clear.  IMPRESSION: Improving right pneumothorax which is now small. Two right chest tubes remain in place.   Electronically Signed   By: Franchot Gallo M.D.   On: 03/21/2013 07:56   Dg Chest Port 1 View  03/20/2013   CLINICAL DATA:  Postop right VATS.  EXAM: PORTABLE CHEST - 1 VIEW  COMPARISON:  03/19/2013  and 03/18/2013.  FINDINGS: 0638 hr. Two right-sided chest tubes remain in place. The right apical pneumothorax is only minimally smaller, approximately 25%. There is extensive right chest wall emphysema. No mediastinal shift is demonstrated. There are stable postsurgical changes at the right hilum. There is stable mild atelectasis at the left lung base. Right IJ central venous catheter appears unchanged.  IMPRESSION: Minimal improvement in moderate size right-sided pneumothorax with persistent chest wall emphysema. No new findings demonstrated.   Electronically Signed   By: Camie Patience M.D.   On: 03/20/2013 09:23     INR: Will add last result for INR, ABG once components are confirmed Will add last 4 CBG results once components are confirmed  Assessment/Plan: S/P Procedure(s) (LRB): VIDEO BRONCHOSCOPY (N/A) Right THORACOTOMY/LOBECTOMY (Right)  1 overall doing well 2 no new labs/ cxr- will order  3 leave CT to suction 4 cont pulm toilet /rehab    LOS: 7 days    GOLD,WAYNE E 1/6/20157:40 AM   Chart reviewed, patient examined, agree with above. CXR is stable. He has persistent 1 chamber air leak with intermittent 3 chamber max. This should stop with time. I am going to try to decrease suction to 10 cm.  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 I discussed the results with the patient and his wife. He will need medical oncology evaluation once he recovers.

## 2013-03-22 NOTE — Progress Notes (Signed)
UR Completed.  Vergie Living [phone] 03/22/2013

## 2013-03-23 LAB — BASIC METABOLIC PANEL
BUN: 21 mg/dL (ref 6–23)
CHLORIDE: 103 meq/L (ref 96–112)
CO2: 26 meq/L (ref 19–32)
CREATININE: 0.76 mg/dL (ref 0.50–1.35)
Calcium: 8.9 mg/dL (ref 8.4–10.5)
GFR calc Af Amer: >90 mL/min (ref >90)
GFR calc non Af Amer: >90 mL/min (ref >90)
GLUCOSE: 89 mg/dL (ref 70–99)
Potassium: 4.5 mEq/L (ref 3.7–5.3)
Sodium: 139 mEq/L (ref 137–147)

## 2013-03-23 LAB — CBC
HEMATOCRIT: 40.4 % (ref 39.0–52.0)
Hemoglobin: 13.7 g/dL (ref 13.0–17.0)
MCH: 32.9 pg (ref 26.0–34.0)
MCHC: 33.9 g/dL (ref 30.0–36.0)
MCV: 97.1 fL (ref 78.0–100.0)
PLATELETS: 259 10*3/uL (ref 150–400)
RBC: 4.16 MIL/uL — ABNORMAL LOW (ref 4.22–5.81)
RDW: 13.7 % (ref 11.5–15.5)
WBC: 8.3 10*3/uL (ref 4.0–10.5)

## 2013-03-23 MED ORDER — MAGNESIUM HYDROXIDE 400 MG/5ML PO SUSP
30.0000 mL | Freq: Every day | ORAL | Status: DC | PRN
  Administered 2013-03-23: 30 mL via ORAL
  Filled 2013-03-23 (×2): qty 30

## 2013-03-23 NOTE — Care Management Note (Addendum)
Page 1 of 2   03/29/2013     3:00:10 PM   CARE MANAGEMENT NOTE 03/29/2013  Patient:  Glen [name],Glen [name]   Account Number:  [id number]  Date Initiated:  03/16/2013  Documentation initiated by:  MAYO,HENRIETTA  Subjective/Objective Assessment:   adm s/p thoracotomy/lobectomy; lives in [place] with spouse     Action/Plan:   Anticipated DC Date:  03/29/2013   Anticipated DC Plan:  HOME W HOME HEALTH SERVICES      DC Planning Services  CM consult      PAC Choice  HOME HEALTH   Choice offered to / List presented to:  C-1 Patient        HH arranged  HH-1 RN      HH agency  [facility]   Status of service:  Completed, signed off Medicare Important Message given?   (If response is "NO", the following Medicare IM given date fields will be blank) Date Medicare IM given:   Date Additional Medicare IM given:    Discharge Disposition:  HOME W HOME HEALTH SERVICES  Per UR Regulation:  Reviewed for med. necessity/level of care/duration of stay  If discussed at Long Length of Stay Meetings, dates discussed:   03/29/2013    Comments:   03/29/13 JULIE AMERSON,RN,BSN [phone] PT FOR DC HOME TODAY WITH [employer] CHEST TUBE.  WILL NEED HHRN FOR DRESSING CHANGES AND CT FOLLOW UP.  PT TO DC TO DAUGHTER'S HOME IN [place].  START OF CARE 24H POST DC DATE. DC ADDRESS IS:    [address] [place],  [place]  [address]    HOME #:  [phone]; WIFE CELL:  [phone]  03/23/13 JULIE AMERSON,RN,BSN [phone] PT ON ROOM AIR; AMBULATING WITHOUT DIFFICULTY WITH CHEST TUBE X 1.  WILL CONT TO FOLLOW PROGRESS.  03/16/13 1050 Henrietta Mayo RN MSN BSN CCM Met with pt and spouse @ bedside.  Per spouse, plan is to spend at least one night in [place] with dtr when pt discharged and then return to [place].  Pt OOB in chair, on O2 4L/min [place] - to ambulate today.   

## 2013-03-23 NOTE — Progress Notes (Signed)
Patient ambulated approx. 600 feet, independently. Tolerated walk very well. This completes patient's second walk for the day. Glen [name] R

## 2013-03-23 NOTE — Progress Notes (Addendum)
[address]       [place] [address]             [phone]      8 Days Post-Op  Procedure(s) (LRB): VIDEO BRONCHOSCOPY (N/A) Right THORACOTOMY/LOBECTOMY (Right) Subjective: Feels a bit constipated, slowly feeling stronger  Objective  Telemetry sinus with occ pvc's  Temp:  [98.1 F (36.7 C)-98.2 F (36.8 C)] 98.1 F (36.7 C) (01/07 0553) Pulse Rate:  [74-79] 79 (01/07 0553) Resp:  [16-18] 18 (01/07 0553) BP: (113-119)/(56-68) 119/68 mmHg (01/07 0553) SpO2:  [94 %-96 %] 96 % (01/07 0553) Weight:  [157 lb 6.5 oz (71.4 kg)] 157 lb 6.5 oz (71.4 kg) (01/07 0553)   Intake/Output Summary (Last 24 hours) at 03/23/13 0736 Last data filed at 03/23/13 0644  Gross per 24 hour  Intake      0 ml  Output    860 ml  Net   -860 ml       General appearance: alert, cooperative and no distress Heart: regular rate and rhythm Lungs: clear to auscultation bilaterally and only takes shallow breaths Abdomen: benign Extremities: no edema Wound: incis healing well  Lab Results:  Recent Labs  03/21/13 0523 03/23/13 0348  NA 143 139  K 4.7 4.5  CL 103 103  CO2 28 26  GLUCOSE 98 89  BUN 22 21  CREATININE 0.73 0.76  CALCIUM 9.1 8.9   No results found for this basename: AST, ALT, ALKPHOS, BILITOT, PROT, ALBUMIN,  in the last 72 hours No results found for this basename: LIPASE, AMYLASE,  in the last 72 hours  Recent Labs  03/21/13 0523 03/23/13 0348  WBC 7.7 8.3  HGB 14.4 13.7  HCT 42.8 40.4  MCV 97.9 97.1  PLT 204 259   No results found for this basename: CKTOTAL, CKMB, TROPONINI,  in the last 72 hours No components found with this basename: POCBNP,  No results found for this basename: DDIMER,  in the last 72 hours No results found for this basename: HGBA1C,  in the last 72 hours No results found for this basename: CHOL, HDL, LDLCALC, TRIG, CHOLHDL,  in the last 72 hours No results found for this basename: TSH, T4TOTAL, FREET3, T3FREE,  THYROIDAB,  in the last 72 hours No results found for this basename: VITAMINB12, FOLATE, FERRITIN, TIBC, IRON, RETICCTPCT,  in the last 72 hours  Medications: Scheduled . bisacodyl  10 mg Oral Daily  . feeding supplement (ENSURE COMPLETE)  237 mL Oral TID WC  . Fluticasone Furoate-Vilanterol  1 puff Inhalation Daily  . furosemide  20 mg Intravenous Daily  . tiotropium  18 mcg Inhalation Daily     Radiology/Studies:  Dg Chest 1 View  03/22/2013   CLINICAL DATA:  Right pneumothorax.  EXAM: CHEST - 1 VIEW  COMPARISON:  03/21/2013  FINDINGS: The small right apical pneumothorax is slightly larger than on the prior exam. Chest tube remains unchanged in position. Subcutaneous emphysema is essentially unchanged. There is new slight atelectasis at the left lung base. Heart size and vascularity are normal.  IMPRESSION: Slight increase in the small right apical pneumothorax.   Electronically Signed   By: Rozetta Nunnery M.D.   On: 03/22/2013 08:59   Dg Chest Port 1 View  03/21/2013   CLINICAL DATA:  Post right upper lobectomy appear chest tube removal.  EXAM: PORTABLE CHEST - 1 VIEW  COMPARISON:  03/21/2013 6:36 a.m.  FINDINGS: One of 2 right-sided chest tubes is  been removed. The very small right apical pneumothorax is smaller than on the prior exam.  Prominent right-sided subcutaneous emphysema and left supraclavicular subcutaneous emphysema once again noted.  Mild elevation right hemidiaphragm.  Postsurgical changes with fullness right hilar region unchanged.  Mild central pulmonary vascular prominence without pulmonary edema.  Heart size within normal limits.  IMPRESSION: One of 2 right-sided chest tubes is been removed. The very small right apical pneumothorax is smaller than on the prior exam.   Electronically Signed   By: Chauncey Cruel M.D.   On: 03/21/2013 15:56   Chest tube - air leak unchanged, 110 ml/24 hr   INR: Will add last result for INR, ABG once components are confirmed Will add last 4 CBG  results once components are confirmed  Assessment/Plan: S/P Procedure(s) (LRB): VIDEO BRONCHOSCOPY (N/A) Right THORACOTOMY/LOBECTOMY (Right)  1 stable with persistent air leak- cont ct at 10 cm, may have to consider [employer] for d/c 2 pathology d/w patient yesterday by Dr Cyndia Bent- adjuvant tx following surgical recovery 3 labs stable 4 MOM prn constipation 5 CXR in am     LOS: 8 days    GOLD,WAYNE E 1/7/20157:36 AM   Chart reviewed, patient examined, agree with above. Will keep on 10 cm suction today and consider trying water seal tomorrow if CXR is ok.

## 2013-03-23 NOTE — Progress Notes (Signed)
Ambulated in hall with patient. Patient ambulated independently on room air. Ambulated approx. 500 feet. Tolerated walk well. This completes patients first walk for today. Glen [name]

## 2013-03-24 ENCOUNTER — Inpatient Hospital Stay (HOSPITAL_COMMUNITY): Payer: Medicare Other

## 2013-03-24 NOTE — Progress Notes (Addendum)
[address]       [employer] [address]             [phone]      9 Days Post-Op  Procedure(s) (LRB): VIDEO BRONCHOSCOPY (N/A) Right THORACOTOMY/LOBECTOMY (Right) Subjective: Feels quite a bit better   Objective  Telemetry sinus with PVC's  Temp:  [97.3 F (36.3 C)-98.1 F (36.7 C)] 98.1 F (36.7 C) (01/08 0550) Pulse Rate:  [77-82] 82 (01/08 0550) Resp:  [18-20] 18 (01/08 0550) BP: (111-118)/(62-69) 111/68 mmHg (01/08 0550) SpO2:  [91 %-98 %] 91 % (01/08 0550) Weight:  [154 lb 8.7 oz (70.1 kg)] 154 lb 8.7 oz (70.1 kg) (01/08 0550)   Intake/Output Summary (Last 24 hours) at 03/24/13 0742 Last data filed at 03/24/13 0700  Gross per 24 hour  Intake    480 ml  Output    340 ml  Net    140 ml       General appearance: alert, cooperative and no distress Heart: regular rate and rhythm Lungs: clear to auscultation bilaterally Abdomen: benign Extremities: no edema Wound: incis healing well  Lab Results:  Recent Labs  03/23/13 0348  NA 139  K 4.5  CL 103  CO2 26  GLUCOSE 89  BUN 21  CREATININE 0.76  CALCIUM 8.9   No results found for this basename: AST, ALT, ALKPHOS, BILITOT, PROT, ALBUMIN,  in the last 72 hours No results found for this basename: LIPASE, AMYLASE,  in the last 72 hours  Recent Labs  03/23/13 0348  WBC 8.3  HGB 13.7  HCT 40.4  MCV 97.1  PLT 259   No results found for this basename: CKTOTAL, CKMB, TROPONINI,  in the last 72 hours No components found with this basename: POCBNP,  No results found for this basename: DDIMER,  in the last 72 hours No results found for this basename: HGBA1C,  in the last 72 hours No results found for this basename: CHOL, HDL, LDLCALC, TRIG, CHOLHDL,  in the last 72 hours No results found for this basename: TSH, T4TOTAL, FREET3, T3FREE, THYROIDAB,  in the last 72 hours No results found for this basename: VITAMINB12, FOLATE, FERRITIN, TIBC, IRON, RETICCTPCT,  in the last 72  hours  Medications: Scheduled . bisacodyl  10 mg Oral Daily  . feeding supplement (ENSURE COMPLETE)  237 mL Oral TID WC  . Fluticasone Furoate-Vilanterol  1 puff Inhalation Daily  . furosemide  20 mg Intravenous Daily  . tiotropium  18 mcg Inhalation Daily     Radiology/Studies:  Dg Chest 1 View  03/22/2013   CLINICAL DATA:  Right pneumothorax.  EXAM: CHEST - 1 VIEW  COMPARISON:  03/21/2013  FINDINGS: The small right apical pneumothorax is slightly larger than on the prior exam. Chest tube remains unchanged in position. Subcutaneous emphysema is essentially unchanged. There is new slight atelectasis at the left lung base. Heart size and vascularity are normal.  IMPRESSION: Slight increase in the small right apical pneumothorax.   Electronically Signed   By: Rozetta Nunnery M.D.   On: 03/22/2013 08:59   chest X-ray- right pntx increased Chest tube- air leak unchanged INR: Will add last result for INR, ABG once components are confirmed Will add last 4 CBG results once components are confirmed  Assessment/Plan: S/P Procedure(s) (LRB): VIDEO BRONCHOSCOPY (N/A) Right THORACOTOMY/LOBECTOMY (Right)  1 feels much better overall, but air leak persists- cont current tx for now   LOS: 9 days    Glen [name],Glen [name]  E 1/8/20157:42 AM   Chart reviewed, patient examined, agree with above. CXR looks stable with small apical space. The air leak is persistent but mostly in the first chamber. Will continue 10 cm suction. I think this is an alveolar air leak and not a bronchopleural fistula so will continue conservative treatment. There is some serous drainage around the chest tube but no pleural effusion.

## 2013-03-25 ENCOUNTER — Inpatient Hospital Stay (HOSPITAL_COMMUNITY): Payer: Medicare Other

## 2013-03-25 NOTE — Progress Notes (Addendum)
[address]       [place] [address]             [phone]      10 Days Post-Op  Procedure(s) (LRB): VIDEO BRONCHOSCOPY (N/A) Right THORACOTOMY/LOBECTOMY (Right) Subjective: conts to feel well  Objective  Telemetry sinus with pvc's  Temp:  [97.3 F (36.3 C)-98.7 F (37.1 C)] 98.7 F (37.1 C) (01/09 0521) Pulse Rate:  [82-90] 82 (01/09 0521) Resp:  [18-20] 20 (01/09 0521) BP: (98-113)/(52-59) 113/59 mmHg (01/09 0521) SpO2:  [95 %-97 %] 95 % (01/09 0521)   Intake/Output Summary (Last 24 hours) at 03/25/13 0728 Last data filed at 03/25/13 2979  Gross per 24 hour  Intake    720 ml  Output    990 ml  Net   -270 ml       General appearance: alert, cooperative and no distress Heart: regular rate and rhythm Lungs: clear to auscultation bilaterally Abdomen: benign Extremities: no edema Wound: incis healing well  Lab Results:  Recent Labs  03/23/13 0348  NA 139  K 4.5  CL 103  CO2 26  GLUCOSE 89  BUN 21  CREATININE 0.76  CALCIUM 8.9   No results found for this basename: AST, ALT, ALKPHOS, BILITOT, PROT, ALBUMIN,  in the last 72 hours No results found for this basename: LIPASE, AMYLASE,  in the last 72 hours  Recent Labs  03/23/13 0348  WBC 8.3  HGB 13.7  HCT 40.4  MCV 97.1  PLT 259   No results found for this basename: CKTOTAL, CKMB, TROPONINI,  in the last 72 hours No components found with this basename: POCBNP,  No results found for this basename: DDIMER,  in the last 72 hours No results found for this basename: HGBA1C,  in the last 72 hours No results found for this basename: CHOL, HDL, LDLCALC, TRIG, CHOLHDL,  in the last 72 hours No results found for this basename: TSH, T4TOTAL, FREET3, T3FREE, THYROIDAB,  in the last 72 hours No results found for this basename: VITAMINB12, FOLATE, FERRITIN, TIBC, IRON, RETICCTPCT,  in the last 72 hours  Medications: Scheduled . bisacodyl  10 mg Oral Daily  . feeding supplement  (ENSURE COMPLETE)  237 mL Oral TID WC  . Fluticasone Furoate-Vilanterol  1 puff Inhalation Daily  . furosemide  20 mg Intravenous Daily  . tiotropium  18 mcg Inhalation Daily     Radiology/Studies:  Dg Chest Port 1 View  03/24/2013   CLINICAL DATA:  Right-sided lobectomy and chest tube. Emphysema. Hemoptysis. COPD.  EXAM: PORTABLE CHEST - 1 VIEW  COMPARISON:  DG CHEST 1 VIEW dated 03/22/2013; DG CHEST 1V PORT dated 03/21/2013  FINDINGS: Right-sided chest tube is unchanged in position. COPD. Midline trachea. Mild cardiomegaly. Moderate right hemidiaphragm elevation. No pleural fluid. The right apical pneumothorax is slightly enlarged. Visceral pleural line measures maximally 2.9 cm from the chest wall today versus 1.9 cm on the prior. Extensive subcutaneous emphysema about the right chest wall.  Probable atelectasis in the left lower lobe. Surgical sutures in the right upper lung.  IMPRESSION: Slight increase in approximately 20% right apical pneumothorax; right-sided chest tube remaining in place.  COPD with postsurgical changes on the right.   Electronically Signed   By: Abigail Miyamoto M.D.   On: 03/24/2013 07:46   Chest tube- no obvious change in air leak  INR: Will add last result for INR, ABG once components are confirmed Will add last 4 CBG  results once components are confirmed  Assessment/Plan: S/P Procedure(s) (LRB): VIDEO BRONCHOSCOPY (N/A) Right THORACOTOMY/LOBECTOMY (Right)   1 Very stable- conts 10 cm H2O  Suction for now   LOS: 10 days    GOLD,WAYNE E 1/9/20157:28 AM   Chart reviewed, patient examined, agree with above. CXR looks stable with small space. He has an air leak with talking or coughing but not with normal respiration. Will put to water seal. I would expect that he will have a larger space or ptx tomorrow on cxr but as long as his lung is not markedly collapsed or he has worsening subcutaneous air I would keep him on water seal.

## 2013-03-26 ENCOUNTER — Inpatient Hospital Stay (HOSPITAL_COMMUNITY): Payer: Medicare Other

## 2013-03-26 NOTE — Progress Notes (Signed)
Right chest tubed dressing changed. Old drainage noted. Site is unremarkable. No redness or s/s of infection. Patient remains in chair. Call bell near.Cindee Salt

## 2013-03-26 NOTE — Progress Notes (Addendum)
       [address]       [place] [address]             [phone]          11 Days Post-Op Procedure(s) (LRB): VIDEO BRONCHOSCOPY (N/A) Right THORACOTOMY/LOBECTOMY (Right)  Subjective: No new complaints.   Objective: Vital signs in last 24 hours: Patient Vitals for the past 24 hrs:  BP Temp Temp src Pulse Resp SpO2  03/26/13 0425 115/64 mmHg 98 F (36.7 C) Oral 79 19 96 %  03/25/13 2128 95/61 mmHg 98.6 F (37 C) Oral 96 18 95 %  03/25/13 1400 108/81 mmHg 97.8 F (36.6 C) Oral 80 18 99 %   Current Weight  03/24/13 154 lb 8.7 oz (70.1 kg)     Intake/Output from previous day: 01/09 0701 - 01/10 0700 In: 1200 [P.O.:1200] Out: 400 [Urine:400]    PHYSICAL EXAM:  Heart: RRR Lungs: Clear Wound: Clean and dry Chest tube: + air leak with cough    Lab Results: CBC:No results found for this basename: WBC, HGB, HCT, PLT,  in the last 72 hours BMET: No results found for this basename: NA, K, CL, CO2, GLUCOSE, BUN, CREATININE, CALCIUM,  in the last 72 hours  PT/INR: No results found for this basename: LABPROT, INR,  in the last 72 hours    CXR:  FINDINGS:  The right chest tube is stable. The right-sided pneumothorax is  slightly larger. It is estimated at 25%. Stable subcutaneous  emphysema. The left lung appears stable. Pleural calcifications  suspect. .  IMPRESSION:  Stable position of the right-sided chest tube but slight interval  enlargement of the right pneumothorax estimated at 25%. Stable  surgical scarring changes involving the right lung.   Assessment/Plan: S/P Procedure(s) (LRB): VIDEO BRONCHOSCOPY (N/A) Right THORACOTOMY/LOBECTOMY (Right) CXR shows slight increase in right apical space, air leak stable. Will continue CT to water seal for now. Continue current care.   LOS: 11 days    COLLINS,GINA H 03/26/2013  Patient seen and examined, agree with above

## 2013-03-27 ENCOUNTER — Inpatient Hospital Stay (HOSPITAL_COMMUNITY): Payer: Medicare Other

## 2013-03-27 NOTE — Progress Notes (Signed)
Patient ambulated 800 ft independently in hallway with family. Returned to room w/out incidence. Call bell and family near.Cindee Salt

## 2013-03-27 NOTE — Progress Notes (Addendum)
       [address]       [place] [address]             [phone]          12 Days Post-Op Procedure(s) (LRB): VIDEO BRONCHOSCOPY (N/A) Right THORACOTOMY/LOBECTOMY (Right)  Subjective: Stable, no complaints.   Objective: Vital signs in last 24 hours: Patient Vitals for the past 24 hrs:  BP Temp Temp src Pulse Resp SpO2  03/27/13 0423 106/58 mmHg 98.5 F (36.9 C) Oral 79 18 92 %  03/26/13 2013 100/61 mmHg 98.2 F (36.8 C) Oral 85 20 94 %  03/26/13 1329 93/52 mmHg 97.9 F (36.6 C) Oral 94 19 94 %   Current Weight  03/24/13 154 lb 8.7 oz (70.1 kg)     Intake/Output from previous day: 01/10 0701 - 01/11 0700 In: 960 [P.O.:960] Out: 2750 [Urine:2700; Chest Tube:50]    PHYSICAL EXAM:  Heart: RRR Lungs: Decreased BS on R Wound: Clean and dry Chest tube: +air leak   Lab Results: CBC:No results found for this basename: WBC, HGB, HCT, PLT,  in the last 72 hours BMET: No results found for this basename: NA, K, CL, CO2, GLUCOSE, BUN, CREATININE, CALCIUM,  in the last 72 hours  PT/INR: No results found for this basename: LABPROT, INR,  in the last 72 hours  CXR: FINDINGS:  A single right chest tube remains. There is mild increase in  residual pneumothorax at the right apex. No edema or significant  pleural fluid is identified. Cardiac and mediastinal contours are  normal.  IMPRESSION:  Mild increase in residual pneumothorax in the superior right  hemithorax.   Assessment/Plan: S/P Procedure(s) (LRB): VIDEO BRONCHOSCOPY (N/A) Right THORACOTOMY/LOBECTOMY (Right) Persistent air leak, CXR generally stable.  Continue CT to water seal.   LOS: 12 days    COLLINS,GINA H 03/27/2013  Patient seen and examined - agree with above His leak is about the same. CXR shows a slightly larger pneumo but he is tolerating it well Keep CT to water seal

## 2013-03-28 ENCOUNTER — Inpatient Hospital Stay (HOSPITAL_COMMUNITY): Payer: Medicare Other

## 2013-03-28 NOTE — Progress Notes (Addendum)
      [address]       [place] [address]             [phone]       13 Days Post-Op Procedure(s) (LRB): VIDEO BRONCHOSCOPY (N/A) Right THORACOTOMY/LOBECTOMY (Right)  Subjective: Patient with complaints of constipation.  Objective: Vital signs in last 24 hours: Temp:  [97.8 F (36.6 C)-98.7 F (37.1 C)] 97.8 F (36.6 C) (01/12 0400) Pulse Rate:  [82-93] 93 (01/12 0400) Cardiac Rhythm:  [-] Sinus tachycardia (01/11 2116) Resp:  [18-20] 20 (01/12 0400) BP: (106-121)/(57-69) 121/69 mmHg (01/12 0400) SpO2:  [93 %-96 %] 93 % (01/12 0400)     Intake/Output from previous day: 01/11 0701 - 01/12 0700 In: 840 [P.O.:840] Out: 3115 [Urine:3075; Chest Tube:40]   Physical Exam:  Cardiovascular: RRR Pulmonary: Diminished at right apex; left lung clear; no rales, wheezes, or rhonchi. Abdomen: Soft, non tender, bowel sounds present. Extremities: Mild bilateral lower extremity edema. Wounds: Clean and dry.  No erythema or signs of infection. Chest Tube: to water seal, air leak present  Lab Results: CBC:No results found for this basename: WBC, HGB, HCT, PLT,  in the last 72 hours BMET: No results found for this basename: NA, K, CL, CO2, GLUCOSE, BUN, CREATININE, CALCIUM,  in the last 72 hours  PT/INR: No results found for this basename: LABPROT, INR,  in the last 72 hours ABG:  INR: Will add last result for INR, ABG once components are confirmed Will add last 4 CBG results once components are confirmed  Assessment/Plan:  1. CV - SR. 2.  Pulmonary - Chest tube is to water seal with an air leak. CXR shows moderate right pneumothorax, volume loss right hemi thorax, and stable subcutaneous emphysema along the right lateral chest wall and neck. Will discuss chest tube management with Dr. Cyndia Bent.Encourage incentive spirometer. 3. LOC constipation.  ZIMMERMAN,DONIELLE MPA-C 03/28/2013,8:07 AM  Chart reviewed, patient examined, agree with above. His CXR looks  fine. He has been on water seal all weekend. The air leak is intermittent and smaller than it was on Friday. This will probably stop soon. I discussed switching to a mini-express and sending him home to his daughter's house in [place] and he is going to think about that. Keep to water seal.

## 2013-03-28 NOTE — Progress Notes (Signed)
Chest tube dressing changed. Site unremarkable with no s/s of infection. Patient tolerated well. AM meds given. Call bell near.Glen [name]

## 2013-03-28 NOTE — Progress Notes (Signed)
Patient resting quietly in chair. Patient has ambulated several times independently with family around unit. Denies any needs at this time. Will monitor.Cindee Salt

## 2013-03-29 ENCOUNTER — Other Ambulatory Visit: Payer: Self-pay | Admitting: *Deleted

## 2013-03-29 ENCOUNTER — Inpatient Hospital Stay (HOSPITAL_COMMUNITY): Payer: Medicare Other

## 2013-03-29 DIAGNOSIS — R918 Other nonspecific abnormal finding of lung field: Secondary | ICD-10-CM

## 2013-03-29 MED ORDER — OXYCODONE-ACETAMINOPHEN 5-325 MG PO TABS: 1.0000 | ORAL_TABLET | ORAL | Status: AC | PRN

## 2013-03-29 NOTE — Progress Notes (Signed)
Nursing Note Pt given discharge instructions medication list, and prescriptions, patient dc home as ordered with mini express chest tube, all questions were answered. Kasean Denherder, [name] RN

## 2013-03-29 NOTE — Discharge Instructions (Signed)
Chest tube to remain to Mini express. Home health to change dressings around chest tube daily.  ACTIVITY:  1.Increase activity slowly. 2.Walk daily and increase frequency and duration as tolerates. 3.May walk up steps. 4.No lifting more than ten pounds for two weeks. 5.No driving for two weeks. 6.Avoid straining. 7.STOP any activity that causes chest pain, shortness of breath, dizziness,sweating,     or excessive weakness. 8.Continue with breathing exercises daily.  DIET:  Regular diet   WOUND:  1.May shower. 2.Clean wounds with mild soap and water.  Call the office at [phone] if any  problems arise.  Thoracotomy Care After Refer to this sheet in the next few weeks. These instructions provide you with information on caring for yourself after your procedure. Your caregiver may also give you more specific instructions. Your treatment has been planned according to current medical practices, but problems sometimes occur. Call your caregiver if you have any problems or questions after your procedure. HOME CARE INSTRUCTIONS  Take over-the-counter or prescription medicines for pain, discomfort, or fever only as directed by your caregiver. It is very important to take pain relieving medicine before your pain becomes severe. You will be able to breathe and cough more comfortably if your pain is well controlled.  Take deep breaths. Deep breathing helps to keep the lungs inflated and protects against a lung infection (pneumonia).  Cough frequently. Even though coughing may cause discomfort, coughing is important to clear mucus (phlegm) and expand your lungs. Coughing helps prevent pneumonia. If it hurts to cough, hold a pillow against your chest when you cough. This may help with the discomfort.  Continue to use an incentive spirometer as directed. The use of an incentive spirometer helps to keep your lungs inflated and protects against pneumonia.  Change the bandages (dressings) over  your incision as needed or as directed by your caregiver.  Remove the dressing over your chest tube site as directed by your caregiver.  Resume your normal diet as directed. It is important to have adequate protein, calories, vitamins, and minerals to promote healing.  Prevent constipation.  Eat high-fiber foods such as whole grain cereals and breads, brown rice, beans, and fresh fruits and vegetables.  Drink enough water and fluids to keep your urine clear or pale yellow. Avoid drinking beverages containing caffeine. Beverages containing caffeine can cause dehydration and harden your stool.  Talk to your caregiver about taking a stool softener or laxative.  Resume activities as directed.  Balance your activity with periods of rest.  Avoid lifting or driving until you are instructed otherwise.  Use showers for bathing until you see your caregiver, or as instructed. Gently wash the area of your incision with water and soap as directed. Do not use anything else to clean your incision except as directed by your caregiver. Do not take baths or sit in a hot tub until directed by your caregiver.  Do not smoke or use tobacco products.  Avoid secondhand smoke.  Schedule an appointment for stitch (suture) or staple removal as directed.  Schedule and attend all follow-up visits as directed by your caregiver. It is important to keep all your appointments.  Participate in pulmonary rehabilitation as directed by your caregiver.  Do not travel by airplane for 2 weeks after the chest tube is removed. SEEK MEDICAL CARE IF:  You are bleeding from your wounds.  Your heartbeat seems irregular.  You have redness, swelling, or increasing pain in the wounds.  There is pus coming from your  wounds.  There is a bad smell coming from the wound or dressing.  You have a fever or chills.  You have nausea or are vomiting.  You have muscle aches. SEEK IMMEDIATE MEDICAL CARE IF:  You have a  rash.  You have difficulty breathing.  You have a reaction or side effect to medicines given.  You have persistent nausea.  You have lightheadedness or feel faint.  You have shortness of breath or chest pain.  You have persistent pain. MAKE SURE YOU:  Understand these instructions.  Will watch your condition.  Will get help right away if you are not doing well or get worse. Document Released: 08/16/2010 Document Revised: 11/26/2011 Document Reviewed: 10/20/2012 [facility] Patient Information 2014 [place], [place].

## 2013-03-29 NOTE — Progress Notes (Signed)
Mini express placed as ordered and dressing change done as ordered will continue to monitor patient. [name], [name]  rN

## 2013-03-29 NOTE — Progress Notes (Signed)
      [address]       [place] [address]             [phone]      14 Days Post-Op Procedure(s) (LRB): VIDEO BRONCHOSCOPY (N/A) Right THORACOTOMY/LOBECTOMY (Right)  Subjective:  Mr. Glen [name] is without complaints this morning.  He has spoken with his daughter about changing chest tube to a MINI Express.  He is agreeable to this, however there is some concern about how long the tube will remain in place, as the patient lives in [place].  He will stay in [place] with his daughter initially.  Objective: Vital signs in last 24 hours: Temp:  [97.5 F (36.4 C)-98 F (36.7 C)] 97.9 F (36.6 C) (01/13 0458) Pulse Rate:  [80-94] 80 (01/13 0458) Cardiac Rhythm:  [-] Sinus tachycardia (01/12 0840) Resp:  [18-20] 20 (01/13 0458) BP: (110-116)/(50-70) 112/59 mmHg (01/13 0458) SpO2:  [94 %-96 %] 94 % (01/13 0458)  Intake/Output from previous day: 01/12 0701 - 01/13 0700 In: 720 [P.O.:720] Out: 625 [Urine:625]  General appearance: alert, cooperative and no distress Heart: regular rate and rhythm Lungs: clear to auscultation bilaterally Abdomen: soft, non-tender; bowel sounds normal; no masses,  no organomegaly Wound: clean and dry  Lab Results: No results found for this basename: WBC, HGB, HCT, PLT,  in the last 72 hours BMET: No results found for this basename: NA, K, CL, CO2, GLUCOSE, BUN, CREATININE, CALCIUM,  in the last 72 hours  PT/INR: No results found for this basename: LABPROT, INR,  in the last 72 hours ABG    Component Value Date/Time   PHART 7.315* 03/16/2013 0424   HCO3 23.8 03/16/2013 0424   TCO2 25 03/16/2013 0424   ACIDBASEDEF 3.0* 03/16/2013 0424   O2SAT 96.0 03/16/2013 0424   CBG (last 3)  No results found for this basename: GLUCAP,  in the last 72 hours  Assessment/Plan: S/P Procedure(s) (LRB): VIDEO BRONCHOSCOPY (N/A) Right THORACOTOMY/LOBECTOMY (Right)  1. Chest tube- continued air leak, CXR appears stable 2. Dispo- patient  medically stable, prolonged air leak- will possibly change chest tube to MINI Express today, however patient wishes to speak with Dr. Cyndia Bent first.  Likely home in AM   LOS: 14 days    Jule Whitsel, [facility] 03/29/2013

## 2013-03-31 ENCOUNTER — Encounter: Payer: Self-pay | Admitting: Surgery

## 2013-03-31 ENCOUNTER — Ambulatory Visit (INDEPENDENT_AMBULATORY_CARE_PROVIDER_SITE_OTHER): Payer: Self-pay | Admitting: Surgery

## 2013-03-31 ENCOUNTER — Other Ambulatory Visit: Payer: Self-pay | Admitting: *Deleted

## 2013-03-31 ENCOUNTER — Ambulatory Visit
Admission: RE | Admit: 2013-03-31 | Discharge: 2013-03-31 | Disposition: A | Discharge status: Home / Self Care | Payer: Medicare Other | Source: Ambulatory Visit | Attending: Surgery | Admitting: Surgery

## 2013-03-31 VITALS — BP 102/68 | HR 76 | Resp 20 | Ht 71.0 in | Wt 154.0 lb

## 2013-03-31 DIAGNOSIS — R918 Other nonspecific abnormal finding of lung field: Secondary | ICD-10-CM

## 2013-03-31 DIAGNOSIS — Z9889 Other specified postprocedural states: Secondary | ICD-10-CM

## 2013-03-31 DIAGNOSIS — Z09 Encounter for follow-up examination after completed treatment for conditions other than malignant neoplasm: Secondary | ICD-10-CM

## 2013-03-31 DIAGNOSIS — Z902 Acquired absence of lung [part of]: Secondary | ICD-10-CM

## 2013-03-31 DIAGNOSIS — J9382 Other air leak: Secondary | ICD-10-CM

## 2013-03-31 DIAGNOSIS — IMO0002 Reserved for concepts with insufficient information to code with codable children: Secondary | ICD-10-CM

## 2013-03-31 DIAGNOSIS — R911 Solitary pulmonary nodule: Secondary | ICD-10-CM

## 2013-03-31 NOTE — Progress Notes (Signed)
      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 He was preliminarily staged as a T2, N1. His postop course was complicated by a prolonged postoperative air leak. He was transitioned to a mini-express and sent home last Friday. He has been feeling better and eating well. He has some chest wall pain but no dyspnea.  Current Outpatient Prescriptions  Medication Sig Dispense Refill  . Cholecalciferol (VITAMIN D) 2000 UNITS CAPS Take 2,000 Units by mouth daily.       . cromolyn (NASALCROM) 5.2 MG/ACT nasal spray Place 1 spray into both nostrils daily.      . Fluticasone Furoate-Vilanterol (BREO ELLIPTA) 100-25 MCG/INH AEPB Inhale 1 puff into the lungs daily.       . Magnesium Oxide (PHILLIPS PO) Take 1 capsule by mouth daily.      Marland Kitchen omeprazole (PRILOSEC) 20 MG capsule Take 20 mg by mouth 2 (two) times daily before a meal.      . oxyCODONE-acetaminophen (PERCOCET/ROXICET) 5-325 MG per tablet Take 1 tablet by mouth every 4 (four) hours as needed for severe pain.  40 tablet  0  . tiotropium (SPIRIVA) 18 MCG inhalation capsule Place 18 mcg into inhaler and inhale daily.       No current facility-administered medications for this visit.     Physical Exam: BP 102/68  Pulse 76  Resp 20  Ht 5\' 11"  (1.803 m)  Wt 154 lb (69.854 kg)  BMI 21.49 kg/m2  SpO2 96% He looks comfortable Lung exam is clear The chest incision is healing well The chest tube has minimal drainage. There is an intermittent air leak with coughing.  Diagnostic Tests:  CLINICAL DATA: Follow up right upper lobectomy  EXAM:  CHEST 2 VIEW  COMPARISON: Prior chest x-ray 03/29/2013  FINDINGS:    Stable position of a right chest tube. Surgical changes of right  upper lobectomy. Persistent right hydro pneumothorax. No significant  interval change. Persistent extensive subcutaneous emphysema along  the right lateral chest wall. Stable cardiac and mediastinal  contours. The left lung remains clear.  IMPRESSION:  1. Stable right hydro pneumothorax without significant interval  change. Right thoracostomy tube remains in unchanged position.  2. Otherwise, the lungs are relatively clear.  Electronically Signed  By: Jacqulynn Cadet M.D.  On: 03/31/2013 14:05    Impression:  He is doing well overall with a small intermittent air leak. I think he should stay on the mini-express for now and I will follow him up next Tuesday. I will refer him to Dr. Julien Nordmann once the tube is out. He lives in [place] but may decide to come here for his treatment.  Plan:  I will see him back next Tuesday with a CXR.

## 2013-04-05 ENCOUNTER — Other Ambulatory Visit: Payer: Self-pay | Admitting: *Deleted

## 2013-04-05 ENCOUNTER — Ambulatory Visit (INDEPENDENT_AMBULATORY_CARE_PROVIDER_SITE_OTHER): Payer: Self-pay | Admitting: Surgery

## 2013-04-05 ENCOUNTER — Ambulatory Visit
Admission: RE | Admit: 2013-04-05 | Discharge: 2013-04-05 | Disposition: A | Discharge status: Home / Self Care | Payer: Medicare Other | Source: Ambulatory Visit | Attending: Surgery | Admitting: Surgery

## 2013-04-05 ENCOUNTER — Encounter: Payer: Self-pay | Admitting: Surgery

## 2013-04-05 VITALS — BP 106/67 | HR 86 | Temp 97.7°F | Resp 16 | Ht 71.0 in | Wt 154.0 lb

## 2013-04-05 DIAGNOSIS — Z9889 Other specified postprocedural states: Secondary | ICD-10-CM

## 2013-04-05 DIAGNOSIS — IMO0002 Reserved for concepts with insufficient information to code with codable children: Secondary | ICD-10-CM

## 2013-04-05 DIAGNOSIS — J9382 Other air leak: Secondary | ICD-10-CM

## 2013-04-05 DIAGNOSIS — C3410 Malignant neoplasm of upper lobe, unspecified bronchus or lung: Secondary | ICD-10-CM

## 2013-04-05 DIAGNOSIS — Z902 Acquired absence of lung [part of]: Secondary | ICD-10-CM

## 2013-04-05 DIAGNOSIS — R918 Other nonspecific abnormal finding of lung field: Secondary | ICD-10-CM

## 2013-04-06 ENCOUNTER — Ambulatory Visit: Payer: Medicare Other | Admitting: Surgery

## 2013-04-09 ENCOUNTER — Encounter: Payer: Self-pay | Admitting: Surgery

## 2013-04-09 NOTE — Progress Notes (Signed)
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 He was preliminarily staged as a T2, N1. His postop course was complicated by a prolonged postoperative air leak. He was transitioned to a mini-express and sent home. I saw him in the office on 03/31/2012 and he still had a small air leak. Since then he says he has been feeling well and gradually gaining strength.   Current Outpatient Prescriptions  Medication Sig Dispense Refill  . Cholecalciferol (VITAMIN D) 2000 UNITS CAPS Take 2,000 Units by mouth daily.       . cromolyn (NASALCROM) 5.2 MG/ACT nasal spray Place 1 spray into both nostrils daily.      . Fluticasone Furoate-Vilanterol (BREO ELLIPTA) 100-25 MCG/INH AEPB Inhale 1 puff into the lungs daily.       . Magnesium Oxide (PHILLIPS PO) Take 1 capsule by mouth daily.      Marland Kitchen omeprazole (PRILOSEC) 20 MG capsule Take 20 mg by mouth 2 (two) times daily before a meal.      . oxyCODONE-acetaminophen (PERCOCET/ROXICET) 5-325 MG per tablet Take 1 tablet by mouth every 4 (four) hours as needed for severe pain.  40 tablet  0  . tiotropium (SPIRIVA) 18 MCG inhalation capsule Place 18 mcg into inhaler and inhale daily.       No current facility-administered medications for this visit.     Physical Exam: BP 106/67  Pulse 86  Temp(Src) 97.7 F (36.5 C) (Oral)  Resp 16  Ht 5\' 11"  (1.803 m)  Wt 154 lb (69.854 kg)  BMI 21.49 kg/m2  SpO2 98% He looks comfortable  Lung exam is clear  The chest incision is healing well  The chest tube has minimal drainage. There is no air leak with coughing.    Diagnostic Tests:  CLINICAL DATA: Right lung lobectomy. Chest tube.    EXAM:  CHEST 2 VIEW  COMPARISON: 03/31/2013  FINDINGS:  Persistent 30% right apical pneumothorax. Right chest tube remains  in place. Persistent air-fluid level at the right lung base  compatible with a hydropneumothorax  Slightly reduced right infrahilar opacity. Left lung remains clear.  Cardiac and mediastinal margins appear normal. Mildly reduced  subcutaneous emphysema along the right thoracoabdominal wall.  IMPRESSION:  1. Right-sided hydropneumothorax, about 30% of right hemithoracic  volume, with chest tube remaining in place.  Electronically Signed  By: Sherryl Barters M.D.  On: 04/05/2013 14:38    Impression:  The air leak has stopped. He has a persistent space due to the remaining middle and lower lobes not filling the pleural space. I removed the chest tube without difficulty and applied vaseline gauze to seal the hole since the chest tube suture had pulled through the skin. I instructed him to keep the vaseline gauze in place for 48 hrs but he can change the dry gauze over it if it gets saturated. I told him he may drain some serous fluid for a few days. I asked him to call me if he develops any dyspnea, productive cough, fever or feeling poorly. Otherwise I will see him on the same day that he comes back to see Dr. Julien Nordmann for oncology consultation.  Plan:  1. Schedule oncology consultation with Dr.  Mohamed.  2. Return to see me with a cxr when he returns to see Dr. Julien Nordmann since he lives in [place].

## 2013-04-14 DIAGNOSIS — Z48813 Encounter for surgical aftercare following surgery on the respiratory system: Secondary | ICD-10-CM

## 2013-04-19 ENCOUNTER — Ambulatory Visit: Payer: Medicare Other | Admitting: Thoracic Surgery (Cardiothoracic Vascular Surgery)

## 2013-04-20 ENCOUNTER — Encounter: Payer: Self-pay | Admitting: Internal Medicine

## 2013-04-20 ENCOUNTER — Telehealth: Payer: Self-pay | Admitting: *Deleted

## 2013-04-20 ENCOUNTER — Encounter: Payer: Self-pay | Admitting: Surgery

## 2013-04-20 ENCOUNTER — Telehealth: Payer: Self-pay | Admitting: Internal Medicine

## 2013-04-20 ENCOUNTER — Other Ambulatory Visit: Payer: Self-pay | Admitting: Internal Medicine

## 2013-04-20 ENCOUNTER — Ambulatory Visit (INDEPENDENT_AMBULATORY_CARE_PROVIDER_SITE_OTHER): Payer: Self-pay | Admitting: Surgery

## 2013-04-20 ENCOUNTER — Ambulatory Visit (HOSPITAL_BASED_OUTPATIENT_CLINIC_OR_DEPARTMENT_OTHER): Payer: Medicare Other | Admitting: Internal Medicine

## 2013-04-20 ENCOUNTER — Ambulatory Visit
Admission: RE | Admit: 2013-04-20 | Discharge: 2013-04-20 | Disposition: A | Discharge status: Home / Self Care | Payer: Medicare Other | Source: Ambulatory Visit | Attending: Surgery | Admitting: Surgery

## 2013-04-20 VITALS — BP 115/70 | HR 86 | Temp 97.5°F | Resp 17 | Ht 71.0 in | Wt 151.5 lb

## 2013-04-20 VITALS — BP 109/66 | HR 84 | Resp 20 | Ht 71.0 in | Wt 151.0 lb

## 2013-04-20 DIAGNOSIS — IMO0002 Reserved for concepts with insufficient information to code with codable children: Secondary | ICD-10-CM

## 2013-04-20 DIAGNOSIS — Z902 Acquired absence of lung [part of]: Secondary | ICD-10-CM

## 2013-04-20 DIAGNOSIS — C3490 Malignant neoplasm of unspecified part of unspecified bronchus or lung: Secondary | ICD-10-CM

## 2013-04-20 DIAGNOSIS — C3410 Malignant neoplasm of upper lobe, unspecified bronchus or lung: Secondary | ICD-10-CM

## 2013-04-20 DIAGNOSIS — R918 Other nonspecific abnormal finding of lung field: Secondary | ICD-10-CM

## 2013-04-20 DIAGNOSIS — Z9889 Other specified postprocedural states: Secondary | ICD-10-CM

## 2013-04-20 DIAGNOSIS — J9382 Other air leak: Secondary | ICD-10-CM

## 2013-04-20 NOTE — Telephone Encounter (Signed)
Gave pt appt for lab,md, chemo, MRI and chemo class for February 2015

## 2013-04-20 NOTE — Progress Notes (Signed)
[place] [phone]   [phone]  CONSULT NOTE  REFERRING PHYSICIAN: Dr. Tiana Loft  REASON FOR CONSULTATION:  68 years old white male recently diagnosed with lung cancer.  HPI Glen [name] is a 68 y.o. male with a past medical history significant for COPD, GERD, degenerative joint disease, benign prostatic hypertrophy, diverticulitis as well as long history of heavy smoking but quit 9 years ago. The patient mentions that he has been complaining of increasing shortness of breath for several months and because of his history of COPD he was seen by his pulmonologist in [place], [place]. Chest x-ray was performed on 02/01/2013 and it showed a questionable small right upper lobe apical mass. This was followed by CT scan of the chest on 02/08/2013 and it showed a solid right upper lobe pulmonary nodule measuring 1.3 CM in size which is suspicious for malignancy. There was a more ill-defined spiculated density in the right lung apex which may represent scarring although the possibility of additional malignant nodule cannot be excluded. A PET scan was performed at [facility] medicine 7 and showed hypermetabolic activity in the 2 right upper lobe lesion as well as questionable right hilum activity. The patient was referred to Dr. Cyndia Bent and on 02/20/2013 he underwent flexible bronchoscopy, right VATS with right upper lobectomy and mediastinal lymph node dissection. 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 The second lesion was consistent with invasive poorly differentiated squamous cell carcinoma measuring 2.2 CM. One lymph node from level 10R was positive for metastatic poorly differentiated squamous cell carcinoma. History was complicated with prolonged requiring prolonged  use of the chest tube.  He is now recovering well from his surgery and he was referred to me by Dr. Cyndia Bent for evaluation and recommendation regarding adjuvant chemotherapy. When seen today the patient continues to have some soreness on the right side of the chest as well as lack of energy and shortness breath with exertion. He lost around 8 pounds over the last few weeks during his surgery. He denied having any significant cough or hemoptysis. The patient denied having any significant headache or visual changes. His family history significant for father who died from complications of rheumatoid fever at age 58 and mother died at age 53 with Alzheimer's.  The patient is married and has 3 biologic children and 2 stepdaughters. He was accompanied by his wife Glen [name].  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 He has a history of smoking 2 packs per day for around 42 years but quit 9 years ago. He also has one alcoholic drinks every night time no history of drug abuse.   HPI  Past Medical History  Diagnosis Date  . Emphysema   . Hemoptysis   . BPH (benign prostatic hyperplasia)   . COPD (chronic obstructive pulmonary disease)   . DJD (degenerative joint disease)     hands  . GERD (gastroesophageal reflux disease)   . Diverticulitis   . Osteoarthritis   . Rhinitis   . Spondylolisthesis at L5-S1 level     squamous papilloma removed in 2012, left ture vocal cord    Past Surgical History  Procedure Laterality Date  . Hernia repair      bilateral injuinal hernia repair 2012 Dr Memory Argue  . Colonoscopy  2008/2013, adenomatous polyps  . Compression fracture   l4 repair    . Laryngoscopy with removal of a left true vocal cord squamous papilloma 2012      Dr Cleon Dew  . Left knee arthroscopy, removed calcified deposit    . Left patella dislocation repair    . Nasal sinus surgery    . Tonsillectomy    . Transurethral resection  of prostate      1996  . Video bronchoscopy N/A 03/15/2013    Procedure: VIDEO BRONCHOSCOPY;  Surgeon: Gaye Pollack, MD;  Location: [facility];  Service: Thoracic;  Laterality: N/A;  . Thoracotomy/lobectomy Right 03/15/2013    Procedure: Right THORACOTOMY/LOBECTOMY;  Surgeon: Gaye Pollack, MD;  Location: [facility] OR;  Service: Thoracic;  Laterality: Right;    Family History  Problem Relation Age of Onset  . Heart disease Father   . Alzheimer's disease Mother     Social History History  Substance Use Topics  . Smoking status: Former Smoker    Types: Cigarettes    Quit date: 03/17/2008  . Smokeless tobacco: Never Used  . Alcohol Use: Yes    No Known Allergies  Current Outpatient Prescriptions  Medication Sig Dispense Refill  . Cholecalciferol (VITAMIN D) 2000 UNITS CAPS Take 2,000 Units by mouth daily.       . cromolyn (NASALCROM) 5.2 MG/ACT nasal spray Place 1 spray into both nostrils daily.      . Fluticasone Furoate-Vilanterol (BREO ELLIPTA) 100-25 MCG/INH AEPB Inhale 1 puff into the lungs daily.       Marland Kitchen ibuprofen (ADVIL,MOTRIN) 200 MG tablet Take 200 mg by mouth every 6 (six) hours as needed.      . Magnesium Oxide (PHILLIPS PO) Take 1 capsule by mouth daily.      Marland Kitchen omeprazole (PRILOSEC) 20 MG capsule Take 20 mg by mouth 2 (two) times daily before a meal.      . tiotropium (SPIRIVA) 18 MCG inhalation capsule Place 18 mcg into inhaler and inhale daily.      Marland Kitchen zolpidem (AMBIEN) 10 MG tablet Take 10 mg by mouth at bedtime as needed for sleep.      Marland Kitchen oxyCODONE-acetaminophen (PERCOCET/ROXICET) 5-325 MG per tablet Take 1 tablet by mouth every 4 (four) hours as needed for severe pain.  40 tablet  0   No current facility-administered medications for this visit.    Review of Systems  Constitutional: positive for fatigue and weight loss Eyes: negative Ears, nose, mouth, throat, and face: negative Respiratory: positive for dyspnea on exertion Cardiovascular: negative Gastrointestinal:  negative Genitourinary:negative Integument/breast: negative Hematologic/lymphatic: negative Musculoskeletal:negative Neurological: negative Behavioral/Psych: negative Endocrine: negative Allergic/Immunologic: negative  Physical Exam  SLH:TDSKA, healthy, no distress, well nourished and well developed SKIN: skin color, texture, turgor are normal, no rashes or significant lesions HEAD: Normocephalic, No masses, lesions, tenderness or abnormalities EYES: normal EARS: External ears normal, Canals clear OROPHARYNX:no exudate, no erythema and lips, buccal mucosa, and tongue normal  NECK: supple, no adenopathy, no JVD LYMPH:  no palpable lymphadenopathy, no hepatosplenomegaly LUNGS: clear to auscultation , and palpation HEART: regular rate & rhythm and no murmurs ABDOMEN:abdomen soft, non-tender, normal bowel sounds and no masses or organomegaly BACK: Back symmetric, no curvature., No CVA tenderness EXTREMITIES:no joint deformities, effusion, or inflammation, no edema, no skin discoloration, no clubbing  NEURO: alert & oriented x 3 with fluent speech, no focal motor/sensory deficits  PERFORMANCE STATUS: ECOG 1  LABORATORY DATA: Lab Results  Component Value Date   WBC 8.3 03/23/2013  HGB 13.7 03/23/2013   HCT 40.4 03/23/2013   MCV 97.1 03/23/2013   PLT 259 03/23/2013      Chemistry      Component Value Date/Time   NA 139 03/23/2013 0348   K 4.5 03/23/2013 0348   CL 103 03/23/2013 0348   CO2 26 03/23/2013 0348   BUN 21 03/23/2013 0348   CREATININE 0.76 03/23/2013 0348      Component Value Date/Time   CALCIUM 8.9 03/23/2013 0348   ALKPHOS 61 03/15/2013 0913   AST 15 03/15/2013 0913   ALT 14 03/15/2013 0913   BILITOT 0.4 03/15/2013 0913       RADIOGRAPHIC STUDIES: Dg Chest 1 View  03/22/2013   CLINICAL DATA:  Right pneumothorax.  EXAM: CHEST - 1 VIEW  COMPARISON:  03/21/2013  FINDINGS: The small right apical pneumothorax is slightly larger than on the prior exam. Chest tube remains unchanged  in position. Subcutaneous emphysema is essentially unchanged. There is new slight atelectasis at the left lung base. Heart size and vascularity are normal.  IMPRESSION: Slight increase in the small right apical pneumothorax.   Electronically Signed   By: Rozetta Nunnery M.D.   On: 03/22/2013 08:59   Dg Chest 2 View  04/05/2013   CLINICAL DATA:  Right lung lobectomy.  Chest tube.  EXAM: CHEST  2 VIEW  COMPARISON:  03/31/2013  FINDINGS: Persistent 30% right apical pneumothorax. Right chest tube remains in place. Persistent air-fluid level at the right lung base compatible with a hydropneumothorax  Slightly reduced right infrahilar opacity. Left lung remains clear. Cardiac and mediastinal margins appear normal. Mildly reduced subcutaneous emphysema along the right thoracoabdominal wall.  IMPRESSION: 1. Right-sided hydropneumothorax, about 30% of right hemithoracic volume, with chest tube remaining in place.   Electronically Signed   By: Sherryl Barters M.D.   On: 04/05/2013 14:38   Dg Chest 2 View  03/31/2013   CLINICAL DATA:  Follow up right upper lobectomy  EXAM: CHEST  2 VIEW  COMPARISON:  Prior chest x-ray 03/29/2013  FINDINGS: Stable position of a right chest tube. Surgical changes of right upper lobectomy. Persistent right hydro pneumothorax. No significant interval change. Persistent extensive subcutaneous emphysema along the right lateral chest wall. Stable cardiac and mediastinal contours. The left lung remains clear.  IMPRESSION: 1. Stable right hydro pneumothorax without significant interval change. Right thoracostomy tube remains in unchanged position. 2. Otherwise, the lungs are relatively clear.   Electronically Signed   By: Jacqulynn Cadet M.D.   On: 03/31/2013 14:05   Dg Chest Port 1 View  03/29/2013   CLINICAL DATA:  Evaluate pneumothorax and chest tube positioning  EXAM: PORTABLE CHEST - 1 VIEW  COMPARISON:  03/28/2013; 03/27/2013; 03/25/2013  FINDINGS: Grossly unchanged cardiac silhouette  and mediastinal contours with atherosclerotic plaque within the thoracic aorta. Stable postsurgical change of the right hilum. Grossly unchanged moderate-sized right-sided pneumothorax which is again noted to contain a basilar component. Stable positioning of a right-sided atypically directed chest tube. Slight worsening of bilateral perihilar opacities favored to represent atelectasis. No definite pleural effusion, though note, the right costophrenic angle is excluded from view. The subcutaneous emphysema about the right lateral chest wall is grossly unchanged. Unchanged bones.  IMPRESSION: 1. Stable positioning of support apparatus. Grossly unchanged moderate-sized right-sided pneumothorax. 2. Stable postsurgical change of the right lung with slight worsening of perihilar atelectasis.   Electronically Signed   By: Sandi Mariscal M.D.   On: 03/29/2013 07:57   Dg Chest Port 1  View  03/28/2013   CLINICAL DATA:  Right pneumothorax, status post VATS.  EXAM: PORTABLE CHEST - 1 VIEW  COMPARISON:  03/27/2013.  FINDINGS: Trachea is midline. Heart size stable. A single right chest tube is seen in the right hemi thorax with a moderate right pneumothorax, stable. Postoperative changes are seen in the right hemi thorax with associated volume loss. Minimal subsegmental atelectasis or scarring at the left lung base. Subcutaneous emphysema along the right neck and chest wall may be minimally improved.  IMPRESSION: 1. Stable moderate right apical pneumothorax with single right chest tube in place. 2. Postoperative changes and mild volume loss in the right hemi thorax, unchanged. 3. Subcutaneous emphysema along the right neck and right chest wall may be minimally improved.   Electronically Signed   By: Lorin Picket M.D.   On: 03/28/2013 07:59   Dg Chest Port 1 View  03/27/2013   CLINICAL DATA:  Status post right VATS with right upper lobectomy for resection of upper lobe pulmonary nodules.  EXAM: PORTABLE CHEST - 1 VIEW   COMPARISON:  03/26/2013  FINDINGS: A single right chest tube remains. There is mild increase in residual pneumothorax at the right apex. No edema or significant pleural fluid is identified. Cardiac and mediastinal contours are normal.  IMPRESSION: Mild increase in residual pneumothorax in the superior right hemithorax.   Electronically Signed   By: Aletta Edouard M.D.   On: 03/27/2013 09:42   Dg Chest Port 1 View  03/25/2013   CLINICAL DATA:  Status post right lobectomy  EXAM: PORTABLE CHEST - 1 VIEW  COMPARISON:  Portable chest x-ray at March 24, 2013  FINDINGS: The pneumothorax on the right is again demonstrated. Knee superior extent is 2.8 cm from the inferior surface of the 2nd rib and is stable as compared to yesterday's study. Considerable subcutaneous emphysema on the right persists. The right-sided chest tube tip lies medially in the right pulmonary apex and is unchanged. There is no mediastinal shift. The left lung is adequately inflated. The interstitial markings are slightly more conspicuous today. There is subsegmental atelectasis inferiorly which is stable. The cardiac silhouette is normal in size. The mediastinum is normal in width.  IMPRESSION: There is a stable appearance of the small right-sided pneumothorax. The position of the chest tube is unchanged. There is persistent subsegmental atelectasis at the left lung base.   Electronically Signed   By: David  Martinique   On: 03/25/2013 08:07   Dg Chest Port 1 View  03/24/2013   CLINICAL DATA:  Right-sided lobectomy and chest tube. Emphysema. Hemoptysis. COPD.  EXAM: PORTABLE CHEST - 1 VIEW  COMPARISON:  DG CHEST 1 VIEW dated 03/22/2013; DG CHEST 1V PORT dated 03/21/2013  FINDINGS: Right-sided chest tube is unchanged in position. COPD. Midline trachea. Mild cardiomegaly. Moderate right hemidiaphragm elevation. No pleural fluid. The right apical pneumothorax is slightly enlarged. Visceral pleural line measures maximally 2.9 cm from the chest wall today  versus 1.9 cm on the prior. Extensive subcutaneous emphysema about the right chest wall.  Probable atelectasis in the left lower lobe. Surgical sutures in the right upper lung.  IMPRESSION: Slight increase in approximately 20% right apical pneumothorax; right-sided chest tube remaining in place.  COPD with postsurgical changes on the right.   Electronically Signed   By: Abigail Miyamoto M.D.   On: 03/24/2013 07:46   Dg Chest Port 1 View  03/21/2013   CLINICAL DATA:  Post right upper lobectomy appear chest tube removal.  EXAM: PORTABLE  CHEST - 1 VIEW  COMPARISON:  03/21/2013 6:36 a.m.  FINDINGS: One of 2 right-sided chest tubes is been removed. The very small right apical pneumothorax is smaller than on the prior exam.  Prominent right-sided subcutaneous emphysema and left supraclavicular subcutaneous emphysema once again noted.  Mild elevation right hemidiaphragm.  Postsurgical changes with fullness right hilar region unchanged.  Mild central pulmonary vascular prominence without pulmonary edema.  Heart size within normal limits.  IMPRESSION: One of 2 right-sided chest tubes is been removed. The very small right apical pneumothorax is smaller than on the prior exam.   Electronically Signed   By: Chauncey Cruel M.D.   On: 03/21/2013 15:56   Dg Chest Port 1v Same Day  03/26/2013   CLINICAL DATA:  Pneumothorax.  EXAM: PORTABLE CHEST - 1 VIEW SAME DAY  COMPARISON:  03/25/2013.  FINDINGS: The right chest tube is stable. The right-sided pneumothorax is slightly larger. It is estimated at 25%. Stable subcutaneous emphysema. The left lung appears stable. Pleural calcifications suspect. .  IMPRESSION: Stable position of the right-sided chest tube but slight interval enlargement of the right pneumothorax estimated at 25%. Stable surgical scarring changes involving the right lung.   Electronically Signed   By: Kalman Jewels M.D.   On: 03/26/2013 08:31    ASSESSMENT: This is a very pleasant 68 years old white male who was  recently diagnosed with non-small cell lung cancer presented with either synchronous primaries including stage IIA (T1a, N1, M0) squamous cell carcinoma as well as a stage IB (T2a, N0,M0) adenosquamous carcinoma with pleural invasion. This is most likely representing a stage IIIA  (T3, N1,M0) non-small cell lung cancer considering the 2 lesions the right upper lobe with the right hilar adenopathy.    PLAN: I had a lengthy discussion with the patient and his wife today about his condition. I explained to the patient that to whether he has synchronous tumor with a stage IIA and 1B or if he has stage IIIA, he would benefit from a course of adjuvant chemotherapy with platinum based regimen which showed improvement in overall 5 years survival by about 10%.  The other option would be observation and close monitoring. The patient is interested in considering adjuvant chemotherapy. He'll be treated with a course of carboplatin for AUC of 5 and paclitaxel 175 mg/M2 every 3 weeks with Neulasta support. I do think the patient would be a good candidate for treatment with cisplatin because of the significant toxicity. I discussed with the patient adverse effect of the chemotherapy including but not limited to alopecia, myelosuppression, nausea and vomiting, peripheral neuropathy, liver or renal dysfunction. I will arrange for the patient to have a chemotherapy education class before starting the first dose of his treatment in 3 weeks. I will complete the staging workup by ordering MRI of the brain to rule out brain metastasis. The patient is going out of town for the next 2 weeks and he would like to start his chemotherapy after he returned from the beach. He would come back for followup visit at that time. He was advised to call immediately if he has any concerning symptoms in the interval. The patient voices understanding of current disease status and treatment options and is in agreement with the current care  plan.  All questions were answered. The patient knows to call the clinic with any problems, questions or concerns. We can certainly see the patient much sooner if necessary.  Thank you so much for allowing me to  participate in the care of Glen [name]. I will continue to follow up the patient with you and assist in his care.  I spent 55 minutes counseling the patient face to face. The total time spent in the appointment was 70 minutes.  Disclaimer: This note was dictated with voice recognition software. Similar sounding words can inadvertently be transcribed and may not be corrected upon review.   Apryle Stowell K. 04/20/2013, 11:37 AM

## 2013-04-20 NOTE — Patient Instructions (Signed)
We discussed adjuvant chemotherapy today.  First dose of chemotherapy scheduled for 05/11/2013 Followup visit at that time

## 2013-04-20 NOTE — Progress Notes (Signed)
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 He was preliminarily staged as a T2, N1. His postop course was complicated by a prolonged postoperative air leak. He was transitioned to a mini-express and sent home. I removed his tube on 04/09/2013.  Since then he says he has been progressively improving. He denies any cough, sputum, fever or chills. He has mild chest wall discomfort. He has been ambulating without dyspnea. He saw Dr. Julien Nordmann this am for medical oncology evaluation.  Current Outpatient Prescriptions  Medication Sig Dispense Refill  . Cholecalciferol (VITAMIN D) 2000 UNITS CAPS Take 2,000 Units by mouth daily.       . cromolyn (NASALCROM) 5.2 MG/ACT nasal spray Place 1 spray into both nostrils daily.      . Fluticasone Furoate-Vilanterol (BREO ELLIPTA) 100-25 MCG/INH AEPB Inhale 1 puff into the lungs daily.       Marland Kitchen ibuprofen (ADVIL,MOTRIN) 200 MG tablet Take 200 mg by mouth every 6 (six) hours as needed.      . Magnesium Oxide (PHILLIPS PO) Take 1 capsule by mouth daily.      Marland Kitchen omeprazole (PRILOSEC) 20 MG capsule Take 20 mg by mouth 2 (two) times daily before a meal.      . oxyCODONE-acetaminophen (PERCOCET/ROXICET) 5-325 MG per tablet Take 1 tablet by mouth every 4 (four) hours as needed for severe pain.  40 tablet  0  . tiotropium (SPIRIVA) 18 MCG inhalation capsule Place 18 mcg into inhaler and inhale daily.      Marland Kitchen zolpidem (AMBIEN) 10 MG tablet Take 10 mg by mouth at bedtime as needed for sleep.       No current facility-administered medications for this visit.     Physical Exam: BP 109/66  Pulse 84  Resp 20  Ht 5\' 11"  (1.803 m)  Wt 151  lb (68.493 kg)  BMI 21.07 kg/m2  SpO2 97% He looks well Lung exam is clear The right chest incisions are healing well  Diagnostic Tests:  CLINICAL DATA: Right lung surgery.  EXAM:  CHEST 2 VIEW  COMPARISON: 04/05/2013.  FINDINGS:  Right pneumothorax has progressed in the interval. Associated fluid  in the right hemi thorax persists. The right chest tube has been  removed although there is persistent right subcutaneous emphysema  seen over the upper right abdominal wall. Left lung is clear. The  cardiopericardial silhouette is within normal limits for size.  Imaged bony structures of the thorax are intact.  IMPRESSION:  Slight interval increase in right hydro pneumothorax.  Persistent subcutaneous emphysema in the upper right abdominal wall  with interval removal of right chest tube.  Electronically Signed  By: Misty Stanley M.D.  On: 04/20/2013 14:14    Impression:  Overall I think he is making a good recovery after right upper lobectomy with a prolonged postop air leak due to incomplete fissures and long staple lines with emphysematous lungs. He has a significant residual right pleural space due to the large size of the emphysematous upper lobe that was removed but this will gradually get smaller and fill with fluid. The residual right lung is fully expanded and well-aerated. He has seen medical oncology here and is planning to get  a second opinion at [facility] and will then decide where to get adjuvant treatment.  Plan:  I will plan to see him back in 3 months with a CXR.

## 2013-04-20 NOTE — Telephone Encounter (Signed)
Per staff message and POF I have scheduled appts.  JMW  

## 2013-04-21 ENCOUNTER — Telehealth: Payer: Self-pay | Admitting: Internal Medicine

## 2013-04-21 NOTE — Telephone Encounter (Signed)
Faxed pt medical records to [facility]. Slides and scans will be fedex'ed °

## 2013-05-06 ENCOUNTER — Telehealth: Payer: Self-pay | Admitting: *Deleted

## 2013-05-06 NOTE — Telephone Encounter (Signed)
Message copied by Britt Bottom on Fri May 06, 2013 10:53 AM ------      Message from: Curt Bears      Created: Thu May 05, 2013  5:37 PM      Regarding: RE: not coming back       ok      ----- Message -----         From: Anders Grant, RN         Sent: 05/05/2013   3:31 PM           To: Curt Bears, MD      Subject: not coming back                                          Pt lives in [place], he called [facility] and stated he is going to go somewhere for treatments closer to home.         ------

## 2013-05-10 ENCOUNTER — Other Ambulatory Visit: Payer: Medicare Other

## 2013-05-10 ENCOUNTER — Ambulatory Visit (HOSPITAL_COMMUNITY): Payer: Medicare Other

## 2013-05-11 ENCOUNTER — Other Ambulatory Visit: Payer: Medicare Other

## 2013-05-11 ENCOUNTER — Ambulatory Visit: Payer: Medicare Other

## 2013-05-11 ENCOUNTER — Ambulatory Visit: Payer: Medicare Other | Admitting: Internal Medicine

## 2013-05-12 ENCOUNTER — Ambulatory Visit: Payer: Medicare Other

## 2013-06-01 ENCOUNTER — Other Ambulatory Visit: Payer: Medicare Other

## 2013-06-01 ENCOUNTER — Ambulatory Visit: Payer: Medicare Other

## 2013-06-02 ENCOUNTER — Ambulatory Visit: Payer: Medicare Other

## 2013-06-06 ENCOUNTER — Other Ambulatory Visit: Payer: Self-pay | Admitting: Internal Medicine

## 2013-07-20 ENCOUNTER — Ambulatory Visit: Payer: Medicare Other | Admitting: Surgery

## 2013-07-26 ENCOUNTER — Other Ambulatory Visit: Payer: Self-pay | Admitting: Surgery

## 2013-07-26 DIAGNOSIS — C3490 Malignant neoplasm of unspecified part of unspecified bronchus or lung: Secondary | ICD-10-CM

## 2013-07-27 ENCOUNTER — Encounter: Payer: Self-pay | Admitting: Surgery

## 2013-07-27 ENCOUNTER — Ambulatory Visit (INDEPENDENT_AMBULATORY_CARE_PROVIDER_SITE_OTHER): Payer: 59 | Admitting: Surgery

## 2013-07-27 ENCOUNTER — Ambulatory Visit
Admission: RE | Admit: 2013-07-27 | Discharge: 2013-07-27 | Disposition: A | Discharge status: Home / Self Care | Payer: 59 | Source: Ambulatory Visit | Attending: Surgery | Admitting: Surgery

## 2013-07-27 VITALS — BP 92/57 | HR 106 | Resp 16 | Ht 71.0 in | Wt 135.5 lb

## 2013-07-27 DIAGNOSIS — C3410 Malignant neoplasm of upper lobe, unspecified bronchus or lung: Secondary | ICD-10-CM

## 2013-07-27 DIAGNOSIS — C3490 Malignant neoplasm of unspecified part of unspecified bronchus or lung: Secondary | ICD-10-CM

## 2013-07-27 DIAGNOSIS — Z9889 Other specified postprocedural states: Secondary | ICD-10-CM

## 2013-07-27 DIAGNOSIS — Z902 Acquired absence of lung [part of]: Secondary | ICD-10-CM

## 2013-07-27 NOTE — Progress Notes (Signed)
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 He was preliminarily staged as a T2, N1. His postop course was complicated by a prolonged postoperative air leak. He was transitioned to a mini-express and sent home. I removed his tube on 04/09/2013. He was seen by Dr. Julien Nordmann and also saw Dr. Mindi Junker at [facility] for oncology evaluation. He decided to receive treatment at [facility] since he lives in [place] and it was more convenient and he was comfortable with his care there. He recently completed chemotherapy. He says he lost his appetite and a lot of weight but tolerated it fairly well. He is still very tired but improving.   Current Outpatient Prescriptions  Medication Sig Dispense Refill  . Cholecalciferol (VITAMIN D) 2000 UNITS CAPS Take 2,000 Units by mouth daily.       . cromolyn (NASALCROM) 5.2 MG/ACT nasal spray Place 1 spray into both nostrils daily.      . Fluticasone Furoate-Vilanterol (BREO ELLIPTA) 100-25 MCG/INH AEPB Inhale 1 puff into the lungs daily.       Marland Kitchen ibuprofen (ADVIL,MOTRIN) 200 MG tablet Take 200 mg by mouth every 6 (six) hours as needed.      . Magnesium Oxide (PHILLIPS PO) Take 1 capsule by mouth daily.      Marland Kitchen oxyCODONE-acetaminophen (PERCOCET/ROXICET) 5-325 MG per tablet Take 1 tablet by mouth every 4 (four) hours as needed for severe pain.  40 tablet  0  . tiotropium (SPIRIVA) 18 MCG inhalation capsule Place 18 mcg into inhaler and inhale daily.      Marland Kitchen zolpidem (AMBIEN) 10 MG tablet Take 10 mg by mouth at bedtime as needed for sleep.      Marland Kitchen omeprazole (PRILOSEC) 20 MG capsule Take 20 mg by mouth 2 (two) times daily before a meal.        No current facility-administered medications for this visit.     Physical Exam: BP 92/57  Pulse 106  Resp 16  Ht 5\' 11"  (1.803 m)  Wt 135 lb 8 oz (61.462 kg)  BMI 18.91 kg/m2  SpO2 93% He looks well but has lost a lot of weight. Lung exam is clear  The right chest incisions are healing well    Diagnostic Tests:  CLINICAL DATA: Post right upper lobectomy for resection of lung  nodules consistent with squamous cell carcinoma with pleural  involvement.  EXAM:  CHEST 2 VIEW  COMPARISON: Chest x-ray of 04/20/2013  FINDINGS:  No residual pneumothorax is seen. Some fluid and/or pleural  thickening does create apical capping on the right. A small right  pleural effusion is noted blunting the costophrenic angle. The left  lung is clear. The lungs are hyperaerated. Left-sided power port  Port-A-Cath is present and mild cardiomegaly is stable. In the  interval the patient has undergone vertebroplasty of a partially  compressed mid thoracic vertebral body.  IMPRESSION:  1. Small right pleural effusion with right apical opacity consistent  with fluid or pleural thickening.  2. No pneumothorax is seen.  3. Interval vertebroplasty of a partially compressed mid thoracic  vertebral body.  Electronically Signed  By: Ivar Drape M.D.  On: 07/27/2013 12:01    Impression:  He is doing  well at this point following chemotherapy. Hopefully he will regain his appetite and his weight.  Plan:  He is going to continue follow up with Dr. Mindi Junker at [facility]. I will be happy to see him back if the need arises.

## 2014-03-17 IMAGING — CR DG CHEST 1V PORT
1 series · 1 of 1 positions shown · non-contrast
Comparison: Prior chest x-ray 03/16/2013

CLINICAL DATA: Status post right upper lobectomy

EXAM:
PORTABLE CHEST - 1 VIEW

[AP]
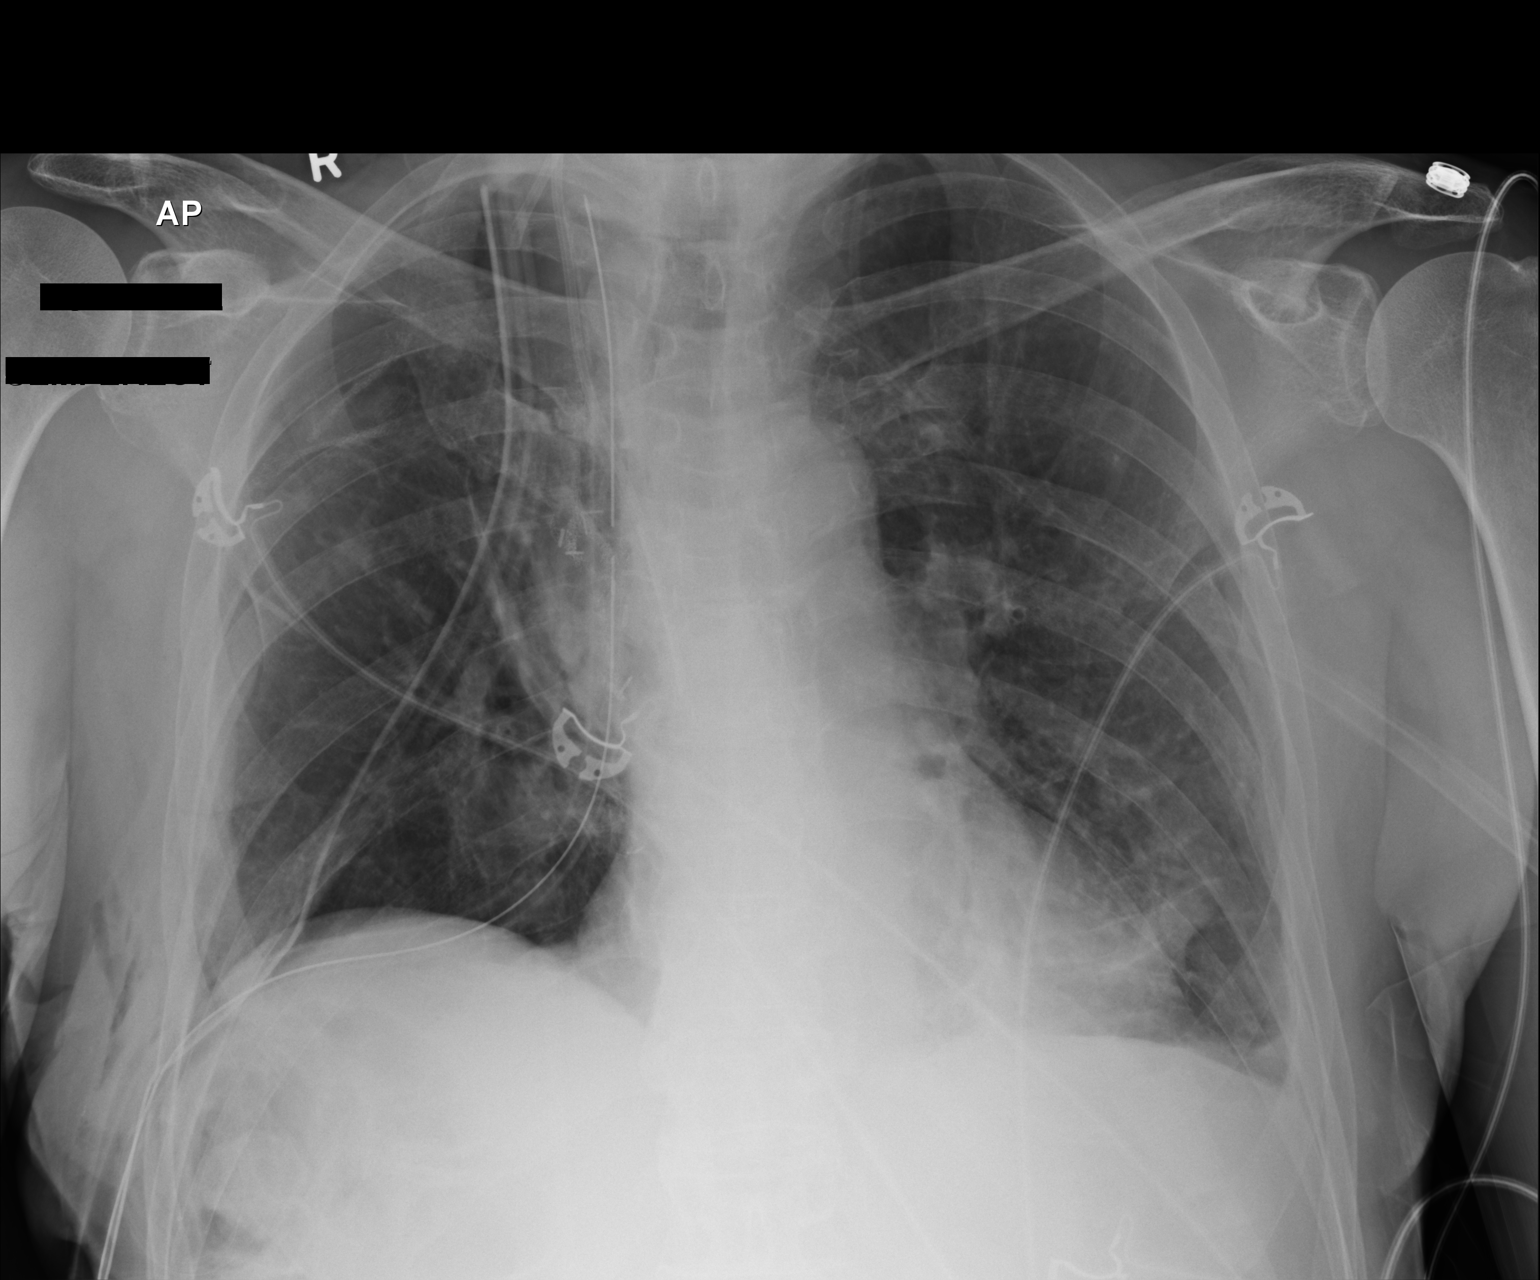

[1 of 1 positions shown; findings below may reference images not displayed]

FINDINGS: The tip of the right IJ central venous catheter projects over the
superior cavoatrial junction. Two right-sided thoracostomy tubes
remain in stable position. Surgical changes of right upper lobectomy
are again evident. Relatively increased lucency in the base
consistent with residual subpulmonic pneumothorax. This is stable.
Slightly increased left basilar atelectasis. Unchanged cardiac and
mediastinal contours. Atherosclerotic calcifications noted in the
transverse aorta.
IMPRESSION: 1. Stable small right pneumothorax with chest tubes in place.
2. Slightly increased left basilar atelectasis.
3. Unchanged support apparatus.

## 2014-03-18 IMAGING — CR DG CHEST 1V PORT
1 series · 1 of 1 positions shown · non-contrast
Comparison: 03/17/2013

CLINICAL DATA: Status post right upper lobectomy

EXAM:
PORTABLE CHEST - 1 VIEW

[AP]
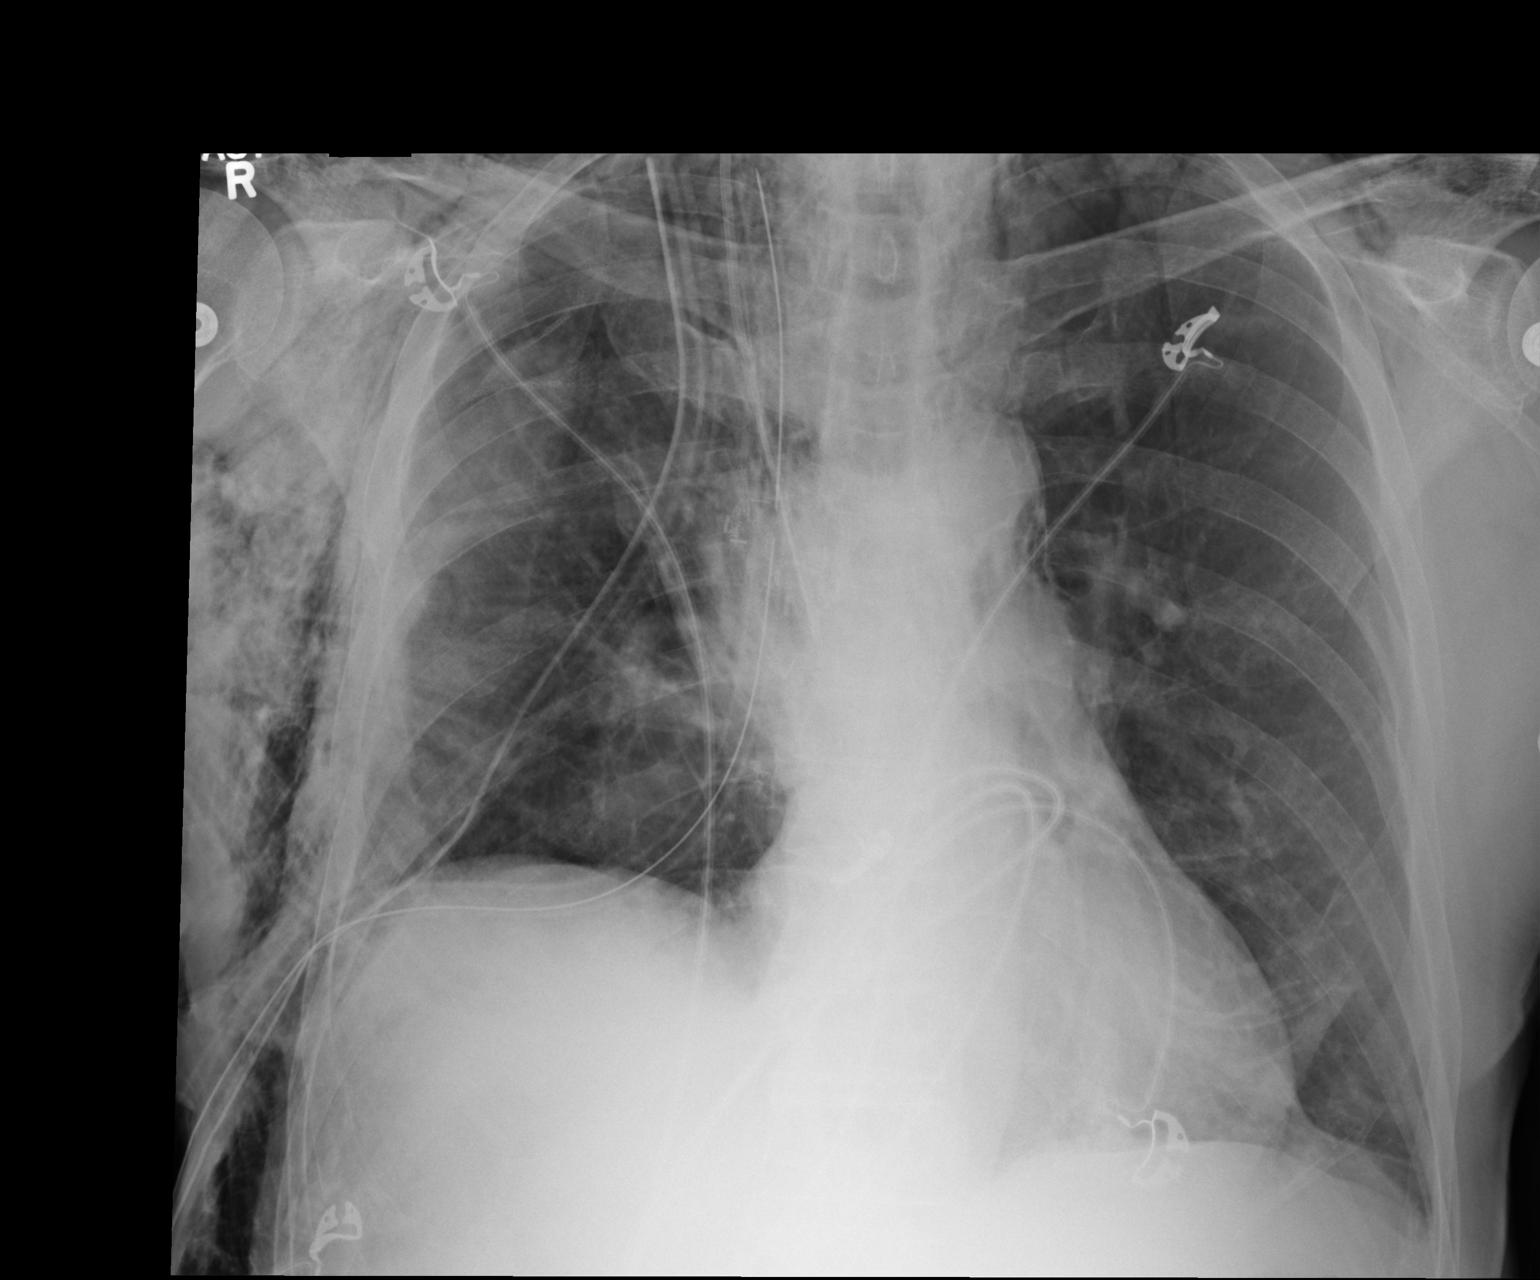

[1 of 1 positions shown; findings below may reference images not displayed]

FINDINGS: The cardiac and mediastinal contours are stable. A right internal
jugular catheter is appreciated with tip projecting in the region
superior vena cava. Two chest tubes appreciated within the right
hemi thorax tips projecting in the right lung apex. The lucency
within the right lung base is unchanged. Otherwise no further
evidence of a pneumothorax. There is increased subcutaneous
emphysema within the base of the neck bilaterally and along the
right chest wall, diffusely. No new focal regions of consolidation
or new focal infiltrates. The density within the left lung base has
decreased in conspicuity likely secondary to improved aeration. The
osseous structures unremarkable.
IMPRESSION: Stable small right basilar tubes pneumothorax. The patient's chest
tubes, right-sided, are stable. Right internal jugular central
venous catheter stable. There has been interval development of
subcutaneous emphysema within the base of the neck and along the
right chest wall. Otherwise no evidence of new focal regions of
consolidation or new focal infiltrates.

## 2014-03-20 IMAGING — CR DG CHEST 1V PORT
1 series · 1 of 1 positions shown · non-contrast
Comparison: 03/19/2013 and 03/18/2013.

CLINICAL DATA: Postop right VATS.

EXAM:
PORTABLE CHEST - 1 VIEW

[AP]
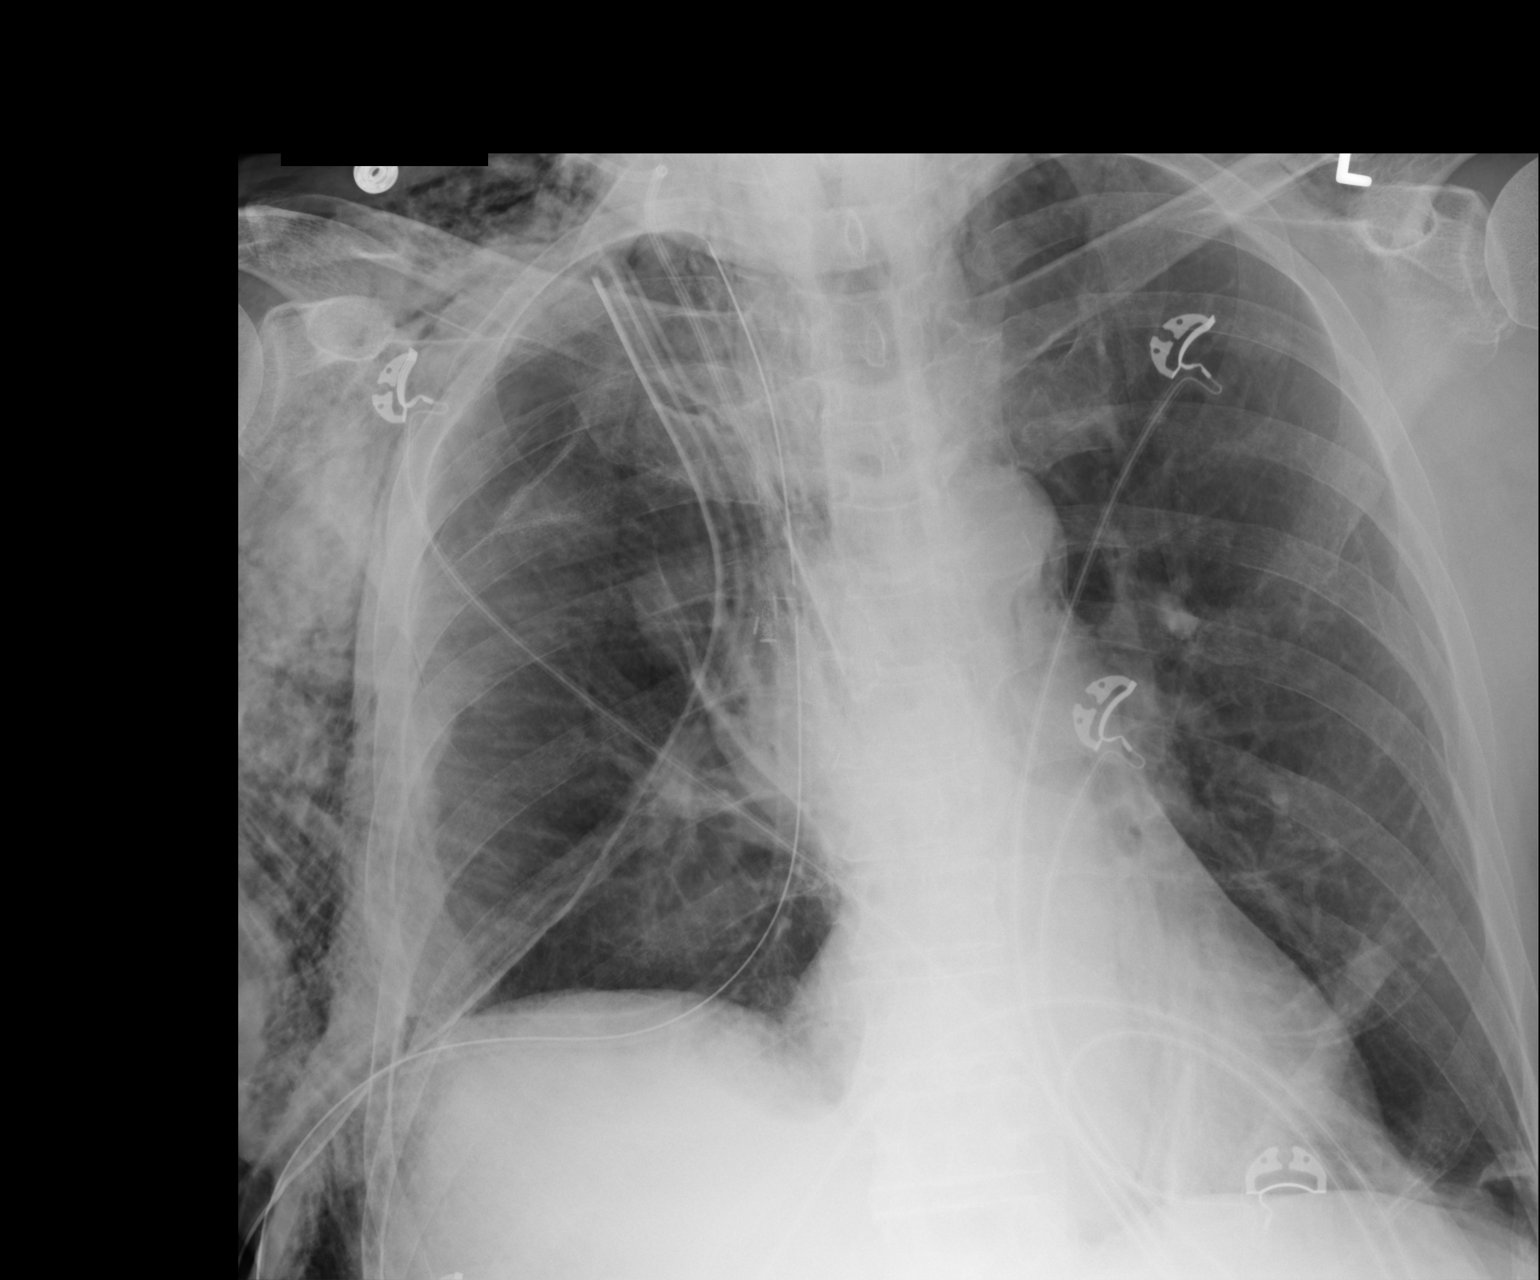

[1 of 1 positions shown; findings below may reference images not displayed]

FINDINGS: 0532 hr. Two right-sided chest tubes remain in place. The right
apical pneumothorax is only minimally smaller, approximately 25%.
There is extensive right chest wall emphysema. No mediastinal shift
is demonstrated. There are stable postsurgical changes at the right
hilum. There is stable mild atelectasis at the left lung base. Right
IJ central venous catheter appears unchanged.
IMPRESSION: Minimal improvement in moderate size right-sided pneumothorax with
persistent chest wall emphysema. No new findings demonstrated.

## 2014-03-21 IMAGING — CR DG CHEST 1V PORT
1 series · 1 of 1 positions shown · non-contrast
Comparison: 03/21/2013 [DATE] a.m..

CLINICAL DATA: Post right upper lobectomy appear chest tube
removal.

EXAM:
PORTABLE CHEST - 1 VIEW

[AP]
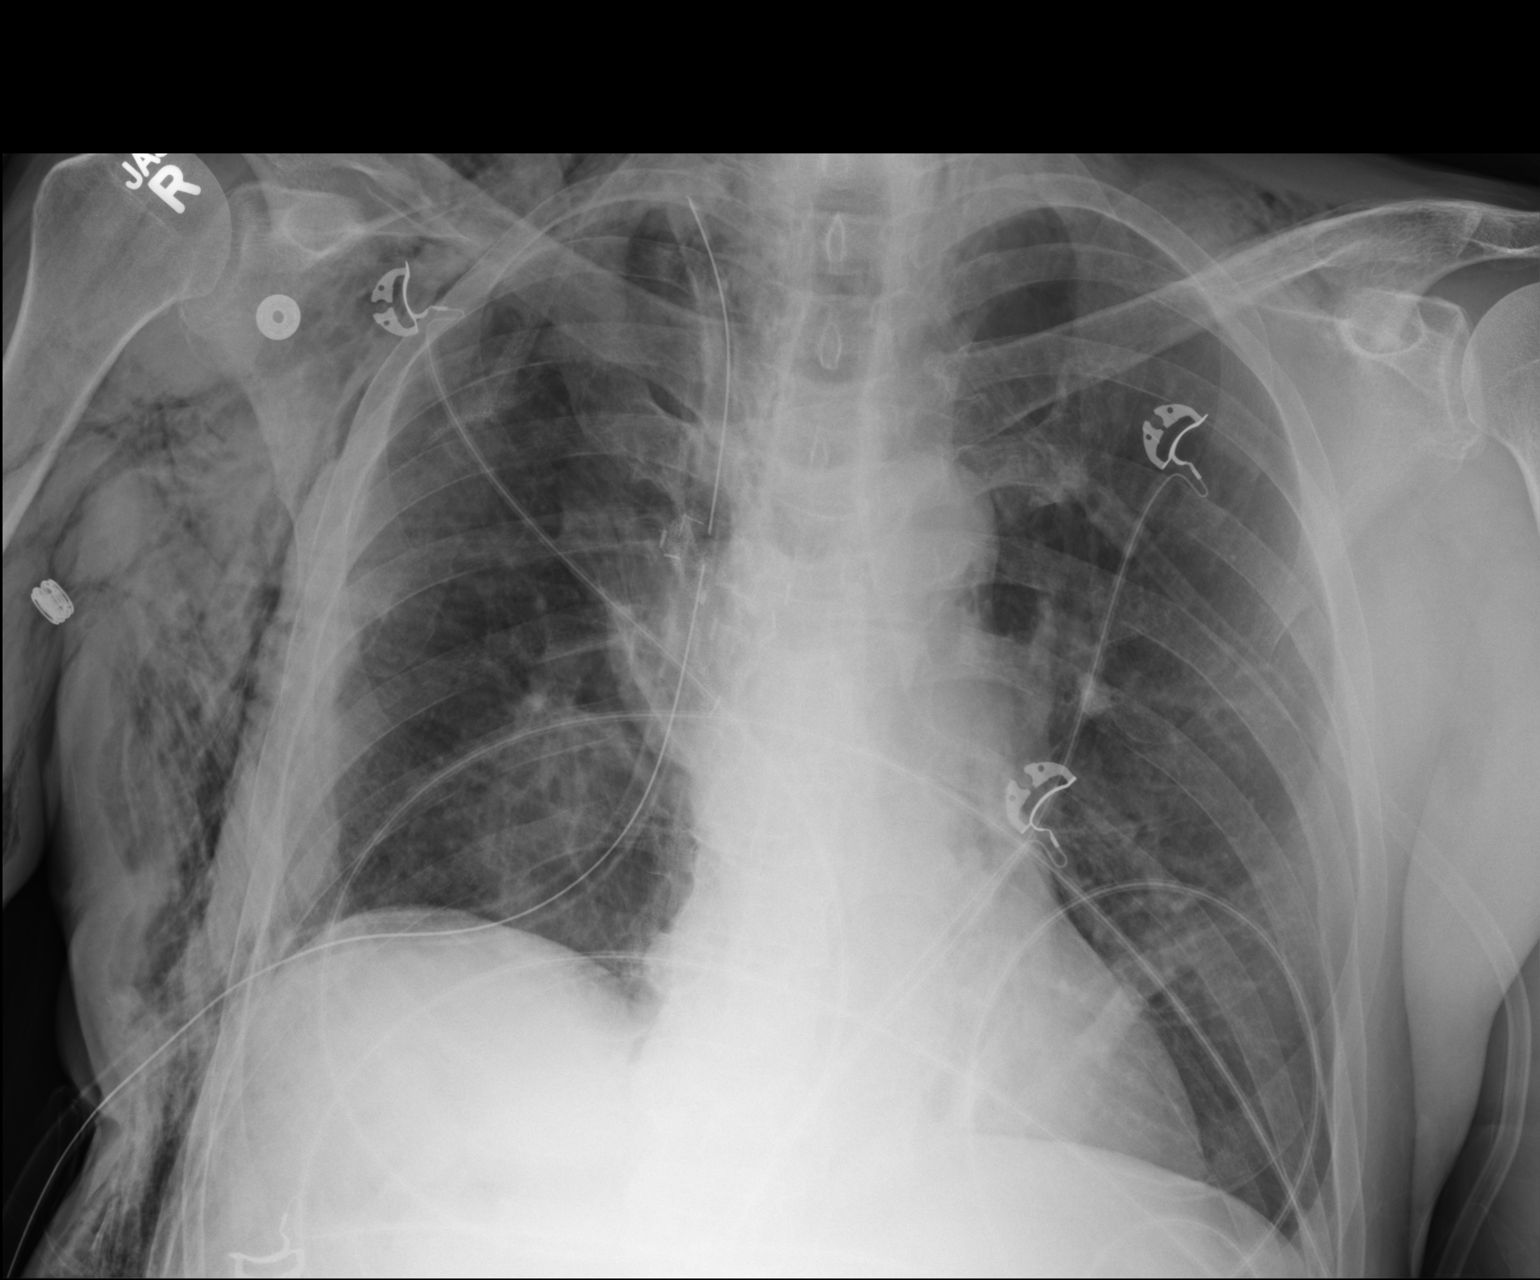

[1 of 1 positions shown; findings below may reference images not displayed]

FINDINGS: One of 2 right-sided chest tubes is been removed. The very small
right apical pneumothorax is smaller than on the prior exam.

Prominent right-sided subcutaneous emphysema and left
supraclavicular subcutaneous emphysema once again noted.

Mild elevation right hemidiaphragm.

Postsurgical changes with fullness right hilar region unchanged.

Mild central pulmonary vascular prominence without pulmonary edema.

Heart size within normal limits.
IMPRESSION: One of 2 right-sided chest tubes is been removed. The very small
right apical pneumothorax is smaller than on the prior exam.

## 2014-03-25 IMAGING — DX DG CHEST 1V PORT
1 series · 1 of 1 positions shown · non-contrast
Comparison: Portable chest x-ray at March 24, 2013

CLINICAL DATA: Status post right lobectomy

EXAM:
PORTABLE CHEST - 1 VIEW

[portable]
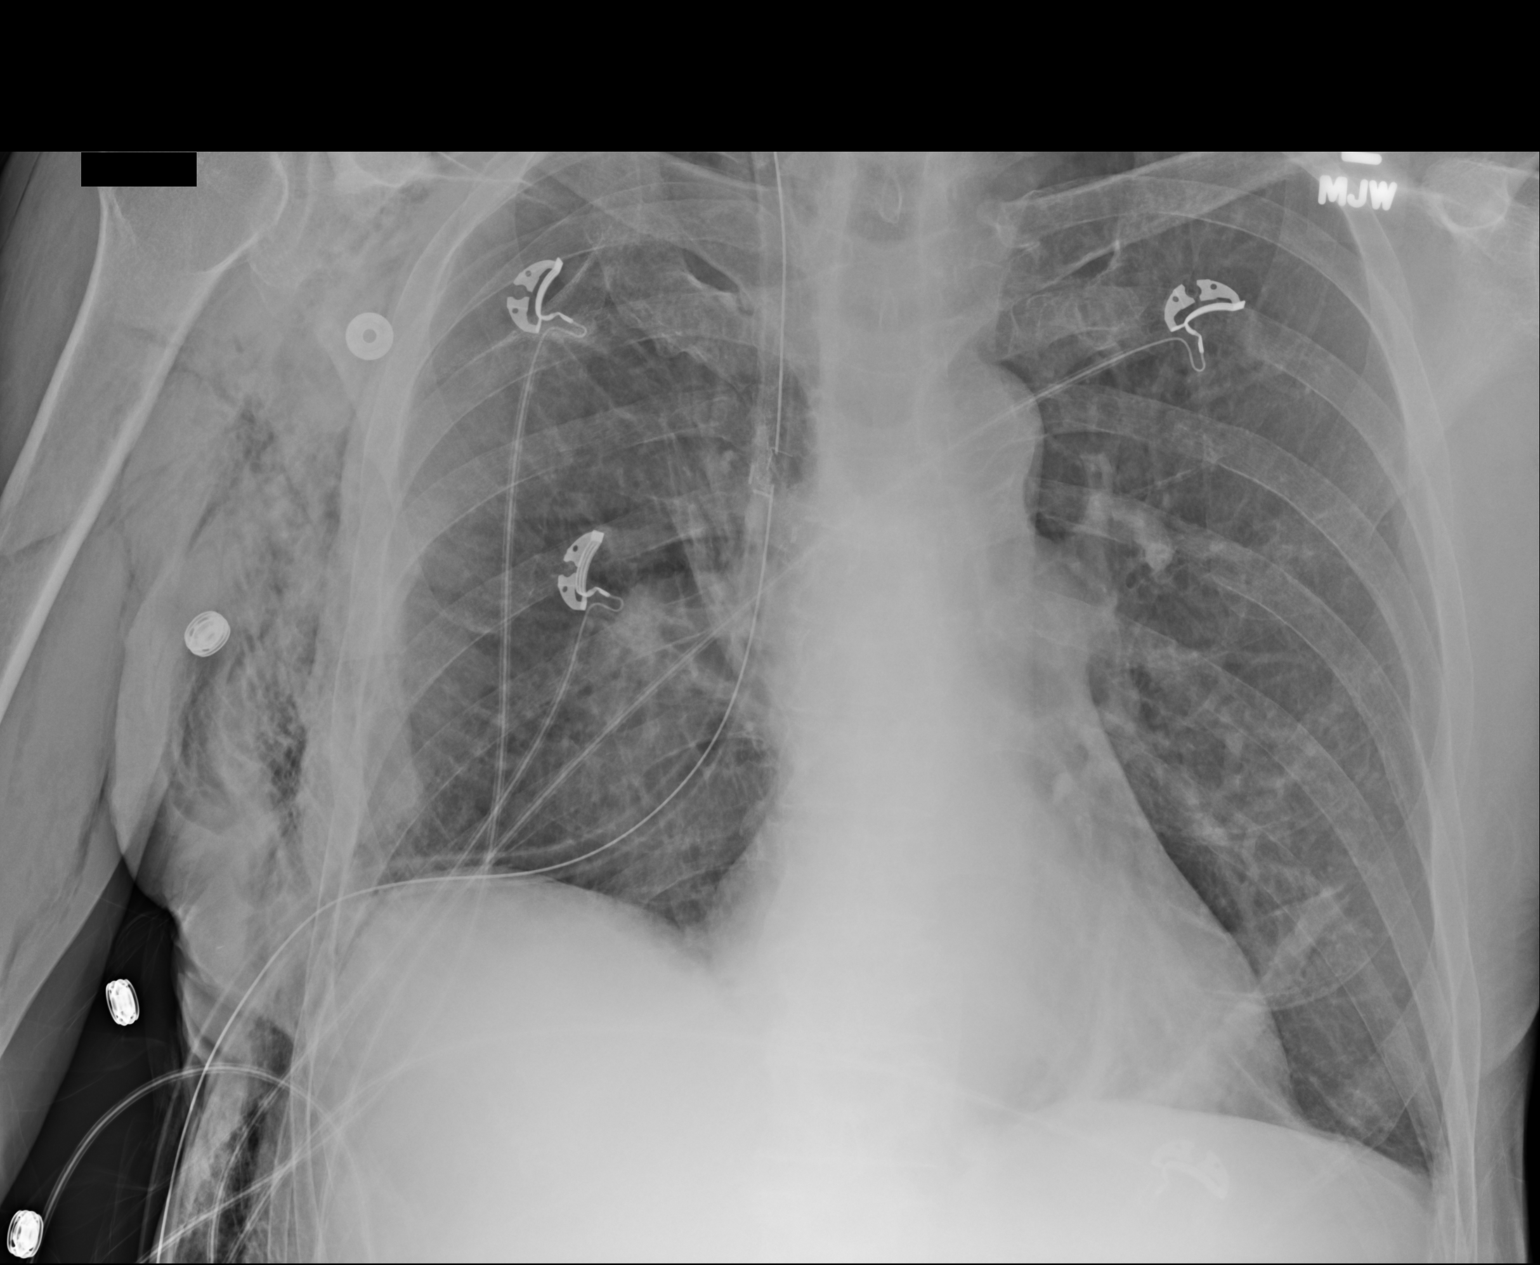

[1 of 1 positions shown; findings below may reference images not displayed]

FINDINGS: The pneumothorax on the right is again demonstrated. Knee superior
extent is 2.8 cm from the inferior surface of the 2nd rib and is
stable as compared to yesterday's study. Considerable subcutaneous
emphysema on the right persists. The right-sided chest tube tip lies
medially in the right pulmonary apex and is unchanged. There is no
mediastinal shift. The left lung is adequately inflated. The
interstitial markings are slightly more conspicuous today. There is
subsegmental atelectasis inferiorly which is stable. The cardiac
silhouette is normal in size. The mediastinum is normal in width.
IMPRESSION: There is a stable appearance of the small right-sided pneumothorax.
The position of the chest tube is unchanged. There is persistent
subsegmental atelectasis at the left lung base.

## 2016-03-27 ENCOUNTER — Other Ambulatory Visit: Payer: Self-pay | Admitting: Nurse Practitioner

## 2016-04-22 ENCOUNTER — Telehealth: Payer: Self-pay | Admitting: *Deleted

## 2016-04-22 NOTE — Telephone Encounter (Signed)
Returned call from "Revonda Humphrey Social Worker with [facility].  Requesting information about his lung cancer.  Staging, treatment plan, CTscans, office notes.  Fax number is [phone]."  This patient was last seen [facility] on 04-20-2013.  Patient notified [facility] after this last visit he will be relocating care closer to [place], [place].  No longer under care of Dr. Curt Bears.  No further questions.

## 2017-08-15 DEATH — deceased
